# Patient Record
Sex: Male | Born: 1945
Health system: Southern US, Community
[De-identification: ages and names within clinical notes are randomized; demographics above are authoritative.]

## PROBLEM LIST (undated history)

## (undated) DIAGNOSIS — K5090 Crohn's disease, unspecified, without complications: Secondary | ICD-10-CM

## (undated) DIAGNOSIS — E119 Type 2 diabetes mellitus without complications: Secondary | ICD-10-CM

## (undated) DIAGNOSIS — R0602 Shortness of breath: Secondary | ICD-10-CM

## (undated) DIAGNOSIS — Z8711 Personal history of peptic ulcer disease: Secondary | ICD-10-CM

## (undated) DIAGNOSIS — R42 Dizziness and giddiness: Secondary | ICD-10-CM

## (undated) DIAGNOSIS — M1990 Unspecified osteoarthritis, unspecified site: Secondary | ICD-10-CM

## (undated) DIAGNOSIS — L0291 Cutaneous abscess, unspecified: Secondary | ICD-10-CM

## (undated) DIAGNOSIS — E7800 Pure hypercholesterolemia, unspecified: Secondary | ICD-10-CM

## (undated) DIAGNOSIS — K760 Fatty (change of) liver, not elsewhere classified: Secondary | ICD-10-CM

## (undated) DIAGNOSIS — K5190 Ulcerative colitis, unspecified, without complications: Secondary | ICD-10-CM

## (undated) DIAGNOSIS — Z8719 Personal history of other diseases of the digestive system: Secondary | ICD-10-CM

## (undated) DIAGNOSIS — Z8673 Personal history of transient ischemic attack (TIA), and cerebral infarction without residual deficits: Secondary | ICD-10-CM

## (undated) DIAGNOSIS — K219 Gastro-esophageal reflux disease without esophagitis: Secondary | ICD-10-CM

## (undated) DIAGNOSIS — E739 Lactose intolerance, unspecified: Secondary | ICD-10-CM

## (undated) HISTORY — DX: Personal history of transient ischemic attack (TIA), and cerebral infarction without residual deficits: Z86.73

## (undated) HISTORY — DX: Crohn's disease, unspecified, without complications: K50.90

## (undated) HISTORY — DX: Ulcerative colitis, unspecified, without complications: K51.90

## (undated) HISTORY — DX: Lactose intolerance, unspecified: E73.9

## (undated) HISTORY — PX: SHOULDER SURGERY: SHX246

## (undated) HISTORY — PX: OTHER SURGICAL HISTORY: SHX169

## (undated) HISTORY — DX: Pure hypercholesterolemia, unspecified: E78.00

## (undated) HISTORY — DX: Dizziness and giddiness: R42

## (undated) HISTORY — DX: Unspecified osteoarthritis, unspecified site: M19.90

## (undated) HISTORY — DX: Type 2 diabetes mellitus without complications: E11.9

## (undated) HISTORY — DX: Shortness of breath: R06.02

## (undated) HISTORY — DX: Fatty (change of) liver, not elsewhere classified: K76.0

---

## 1983-08-20 HISTORY — PX: OTHER SURGICAL HISTORY: SHX169

## 2008-01-18 ENCOUNTER — Encounter: Payer: Self-pay | Admitting: Internal Medicine

## 2008-01-18 ENCOUNTER — Encounter: Payer: Self-pay | Admitting: Physician Assistant

## 2008-01-26 ENCOUNTER — Encounter: Payer: Self-pay | Admitting: Internal Medicine

## 2008-04-27 ENCOUNTER — Encounter: Payer: Self-pay | Admitting: Internal Medicine

## 2009-06-14 ENCOUNTER — Ambulatory Visit: Payer: Self-pay | Admitting: Physician Assistant

## 2009-06-14 DIAGNOSIS — K5730 Diverticulosis of large intestine without perforation or abscess without bleeding: Secondary | ICD-10-CM | POA: Insufficient documentation

## 2009-06-14 DIAGNOSIS — R0602 Shortness of breath: Secondary | ICD-10-CM | POA: Insufficient documentation

## 2009-06-14 DIAGNOSIS — K7689 Other specified diseases of liver: Secondary | ICD-10-CM | POA: Insufficient documentation

## 2009-06-14 DIAGNOSIS — K219 Gastro-esophageal reflux disease without esophagitis: Secondary | ICD-10-CM | POA: Insufficient documentation

## 2009-06-14 DIAGNOSIS — K5190 Ulcerative colitis, unspecified, without complications: Secondary | ICD-10-CM | POA: Insufficient documentation

## 2009-06-14 DIAGNOSIS — M1990 Unspecified osteoarthritis, unspecified site: Secondary | ICD-10-CM | POA: Insufficient documentation

## 2009-06-15 ENCOUNTER — Encounter: Payer: Self-pay | Admitting: Physician Assistant

## 2009-06-15 ENCOUNTER — Ambulatory Visit (HOSPITAL_COMMUNITY): Admission: RE | Admit: 2009-06-15 | Discharge: 2009-06-15 | Payer: Self-pay | Admitting: Physician Assistant

## 2009-06-23 LAB — CONVERTED CEMR LAB
ALT: 27 units/L (ref 0–53)
AST: 24 units/L (ref 0–37)
Albumin: 3.9 g/dL (ref 3.5–5.2)
Alkaline Phosphatase: 85 units/L (ref 39–117)
BUN: 17 mg/dL (ref 6–23)
Basophils Absolute: 0.1 10*3/uL (ref 0.0–0.1)
Basophils Relative: 1 % (ref 0–1)
CO2: 23 meq/L (ref 19–32)
Calcium: 8.7 mg/dL (ref 8.4–10.5)
Chloride: 107 meq/L (ref 96–112)
Creatinine, Ser: 1.02 mg/dL (ref 0.40–1.50)
Eosinophils Absolute: 0.4 10*3/uL (ref 0.0–0.7)
Eosinophils Relative: 5 % (ref 0–5)
Glucose, Bld: 110 mg/dL — ABNORMAL HIGH (ref 70–99)
HCT: 45.1 % (ref 39.0–52.0)
Hemoglobin: 15.2 g/dL (ref 13.0–17.0)
Lymphocytes Relative: 48 % — ABNORMAL HIGH (ref 12–46)
Lymphs Abs: 3.8 10*3/uL (ref 0.7–4.0)
MCHC: 33.7 g/dL (ref 30.0–36.0)
MCV: 90.4 fL (ref 78.0–100.0)
Monocytes Absolute: 0.6 10*3/uL (ref 0.1–1.0)
Monocytes Relative: 7 % (ref 3–12)
Neutro Abs: 3.1 10*3/uL (ref 1.7–7.7)
Neutrophils Relative %: 39 % — ABNORMAL LOW (ref 43–77)
Platelets: 180 10*3/uL (ref 150–400)
Potassium: 3.9 meq/L (ref 3.5–5.3)
Pro B Natriuretic peptide (BNP): 7.3 pg/mL (ref 0.0–100.0)
RBC: 4.99 M/uL (ref 4.22–5.81)
RDW: 12.5 % (ref 11.5–15.5)
Sodium: 142 meq/L (ref 135–145)
TSH: 2.179 microintl units/mL (ref 0.350–4.500)
Total Bilirubin: 0.3 mg/dL (ref 0.3–1.2)
Total Protein: 7.2 g/dL (ref 6.0–8.3)
WBC: 8 10*3/uL (ref 4.0–10.5)

## 2009-06-28 ENCOUNTER — Telehealth: Payer: Self-pay | Admitting: Physician Assistant

## 2009-07-12 ENCOUNTER — Ambulatory Visit: Payer: Self-pay | Admitting: Physician Assistant

## 2009-07-17 ENCOUNTER — Telehealth: Payer: Self-pay | Admitting: Physician Assistant

## 2009-07-17 LAB — CONVERTED CEMR LAB
Cholesterol: 278 mg/dL — ABNORMAL HIGH (ref 0–200)
HDL: 47 mg/dL (ref >39)
LDL Cholesterol: 161 mg/dL — ABNORMAL HIGH (ref 0–99)
Total CHOL/HDL Ratio: 5.9
Triglycerides: 349 mg/dL — ABNORMAL HIGH (ref <150)
VLDL: 70 mg/dL — ABNORMAL HIGH (ref 0–40)

## 2009-07-27 ENCOUNTER — Ambulatory Visit: Payer: Self-pay | Admitting: Physician Assistant

## 2009-07-27 DIAGNOSIS — K644 Residual hemorrhoidal skin tags: Secondary | ICD-10-CM | POA: Insufficient documentation

## 2009-07-27 LAB — CONVERTED CEMR LAB
Blood Glucose, Fingerstick: 98
Hgb A1c MFr Bld: 5.8 %
OCCULT 1: NEGATIVE

## 2009-07-31 ENCOUNTER — Encounter: Payer: Self-pay | Admitting: Physician Assistant

## 2009-08-02 ENCOUNTER — Encounter: Payer: Self-pay | Admitting: Physician Assistant

## 2009-08-02 ENCOUNTER — Ambulatory Visit (HOSPITAL_COMMUNITY): Admission: RE | Admit: 2009-08-02 | Discharge: 2009-08-02 | Payer: Self-pay | Admitting: Internal Medicine

## 2009-08-02 ENCOUNTER — Ambulatory Visit: Payer: Self-pay | Admitting: Internal Medicine

## 2009-08-02 ENCOUNTER — Encounter (INDEPENDENT_AMBULATORY_CARE_PROVIDER_SITE_OTHER): Payer: Self-pay | Admitting: Internal Medicine

## 2009-08-08 ENCOUNTER — Encounter: Payer: Self-pay | Admitting: Physician Assistant

## 2009-08-09 ENCOUNTER — Encounter: Payer: Self-pay | Admitting: Physician Assistant

## 2009-09-08 ENCOUNTER — Ambulatory Visit (HOSPITAL_BASED_OUTPATIENT_CLINIC_OR_DEPARTMENT_OTHER): Admission: RE | Admit: 2009-09-08 | Discharge: 2009-09-08 | Payer: Self-pay | Admitting: Physician Assistant

## 2009-09-08 ENCOUNTER — Encounter: Payer: Self-pay | Admitting: Physician Assistant

## 2009-09-09 ENCOUNTER — Ambulatory Visit: Payer: Self-pay | Admitting: Internal Medicine

## 2009-09-18 ENCOUNTER — Encounter: Payer: Self-pay | Admitting: Physician Assistant

## 2009-09-18 DIAGNOSIS — G4730 Sleep apnea, unspecified: Secondary | ICD-10-CM | POA: Insufficient documentation

## 2009-10-12 ENCOUNTER — Ambulatory Visit (HOSPITAL_BASED_OUTPATIENT_CLINIC_OR_DEPARTMENT_OTHER): Admission: RE | Admit: 2009-10-12 | Discharge: 2009-10-12 | Payer: Self-pay | Admitting: Physician Assistant

## 2009-10-12 ENCOUNTER — Encounter: Payer: Self-pay | Admitting: Physician Assistant

## 2009-10-15 ENCOUNTER — Ambulatory Visit: Payer: Self-pay | Admitting: Internal Medicine

## 2009-10-25 ENCOUNTER — Ambulatory Visit: Payer: Self-pay | Admitting: Physician Assistant

## 2009-10-25 ENCOUNTER — Encounter (INDEPENDENT_AMBULATORY_CARE_PROVIDER_SITE_OTHER): Payer: Self-pay | Admitting: *Deleted

## 2009-10-25 DIAGNOSIS — R7309 Other abnormal glucose: Secondary | ICD-10-CM | POA: Insufficient documentation

## 2009-10-26 DIAGNOSIS — E739 Lactose intolerance, unspecified: Secondary | ICD-10-CM | POA: Insufficient documentation

## 2009-10-26 LAB — CONVERTED CEMR LAB
Hgb A1c MFr Bld: 6.1 % (ref 4.6–6.1)
PSA: 1.96 ng/mL (ref 0.10–4.00)

## 2009-10-29 ENCOUNTER — Encounter: Payer: Self-pay | Admitting: Physician Assistant

## 2009-11-07 ENCOUNTER — Ambulatory Visit: Payer: Self-pay | Admitting: Physician Assistant

## 2009-11-27 ENCOUNTER — Ambulatory Visit: Payer: Self-pay | Admitting: Internal Medicine

## 2009-11-27 ENCOUNTER — Encounter (INDEPENDENT_AMBULATORY_CARE_PROVIDER_SITE_OTHER): Payer: Self-pay | Admitting: *Deleted

## 2009-12-01 ENCOUNTER — Ambulatory Visit: Payer: Self-pay | Admitting: Internal Medicine

## 2009-12-06 ENCOUNTER — Encounter: Payer: Self-pay | Admitting: Internal Medicine

## 2010-02-06 ENCOUNTER — Encounter: Payer: Self-pay | Admitting: Physician Assistant

## 2010-02-26 ENCOUNTER — Telehealth: Payer: Self-pay | Admitting: Physician Assistant

## 2010-02-26 ENCOUNTER — Ambulatory Visit: Payer: Self-pay | Admitting: Physician Assistant

## 2010-02-26 DIAGNOSIS — E785 Hyperlipidemia, unspecified: Secondary | ICD-10-CM | POA: Insufficient documentation

## 2010-02-26 LAB — CONVERTED CEMR LAB: Blood Glucose, Fingerstick: 94

## 2010-02-27 LAB — CONVERTED CEMR LAB
ALT: 24 units/L (ref 0–53)
AST: 21 units/L (ref 0–37)
Albumin: 4.4 g/dL (ref 3.5–5.2)
Alkaline Phosphatase: 70 units/L (ref 39–117)
BUN: 18 mg/dL (ref 6–23)
CO2: 20 meq/L (ref 19–32)
Calcium: 8.5 mg/dL (ref 8.4–10.5)
Chloride: 107 meq/L (ref 96–112)
Creatinine, Ser: 1.16 mg/dL (ref 0.40–1.50)
Glucose, Bld: 107 mg/dL — ABNORMAL HIGH (ref 70–99)
Hgb A1c MFr Bld: 5.8 % — ABNORMAL HIGH (ref <5.7)
Potassium: 3.8 meq/L (ref 3.5–5.3)
Sodium: 140 meq/L (ref 135–145)
Total Bilirubin: 0.4 mg/dL (ref 0.3–1.2)
Total Protein: 7.4 g/dL (ref 6.0–8.3)

## 2010-02-28 ENCOUNTER — Encounter: Payer: Self-pay | Admitting: Physician Assistant

## 2010-03-08 ENCOUNTER — Telehealth: Payer: Self-pay | Admitting: Physician Assistant

## 2010-03-09 ENCOUNTER — Encounter (INDEPENDENT_AMBULATORY_CARE_PROVIDER_SITE_OTHER): Payer: Self-pay | Admitting: *Deleted

## 2010-05-29 ENCOUNTER — Ambulatory Visit: Payer: Self-pay | Admitting: Physician Assistant

## 2010-05-30 LAB — CONVERTED CEMR LAB
ALT: 25 units/L (ref 0–53)
AST: 26 units/L (ref 0–37)
Albumin: 4.1 g/dL (ref 3.5–5.2)
Alkaline Phosphatase: 79 units/L (ref 39–117)
Bilirubin, Direct: 0.2 mg/dL (ref 0.0–0.3)
Cholesterol: 208 mg/dL — ABNORMAL HIGH (ref 0–200)
HDL: 55 mg/dL (ref >39)
Indirect Bilirubin: 0.5 mg/dL (ref 0.0–0.9)
LDL Cholesterol: 116 mg/dL — ABNORMAL HIGH (ref 0–99)
Total Bilirubin: 0.7 mg/dL (ref 0.3–1.2)
Total CHOL/HDL Ratio: 3.8
Total Protein: 7.3 g/dL (ref 6.0–8.3)
Triglycerides: 183 mg/dL — ABNORMAL HIGH (ref <150)
VLDL: 37 mg/dL (ref 0–40)

## 2010-06-06 ENCOUNTER — Encounter (INDEPENDENT_AMBULATORY_CARE_PROVIDER_SITE_OTHER): Payer: Self-pay | Admitting: *Deleted

## 2010-08-14 ENCOUNTER — Ambulatory Visit: Payer: Self-pay | Admitting: Physician Assistant

## 2010-08-14 DIAGNOSIS — B029 Zoster without complications: Secondary | ICD-10-CM | POA: Insufficient documentation

## 2010-08-14 LAB — CONVERTED CEMR LAB
Blood Glucose, Fingerstick: 100
Hgb A1c MFr Bld: 5.8 %

## 2010-09-18 NOTE — Miscellaneous (Signed)
Summary: Sleep Apnea dx by sleep study  Patient has sleep apnea. Patient needs referral for CPAP titration. Order in system.   will fax... Armenia Shannon  September 20, 2009 9:16 AM  Clinical Lists Changes  Problems: Added new problem of SLEEP APNEA (ICD-780.57) - Signed Orders: Added new Referral order of Sleep Disorder Referral (Sleep Disorder) - Signed Observations: Added new observation of PAST MED HX: Diverticulosis Ulcerative Colitis    a.  colonoscopy June 2009 c/w diverticulosis and ulcerative colitis    b.  had burning and rectal bleeding which prompted work up Fatty Liver on u/s 01/2008 (u/s o/w normal at that time) GERD Mild dyslipidemia in past Intestinal parasite infection treated over last 20 years Osteoarthritis Echo 08/02/2009: Normal LVF; Grade 1 Diast. Dysfxn; no valve issues Sleep Apnea   a.  Mod. OSA by sleep study 09/08/2009; AHI 18.3 per hour; O2 sat nadir of 88% (09/18/2009 16:17)       Past History:  Past Medical History: Diverticulosis Ulcerative Colitis    a.  colonoscopy June 2009 c/w diverticulosis and ulcerative colitis    b.  had burning and rectal bleeding which prompted work up Fatty Liver on u/s 01/2008 (u/s o/w normal at that time) GERD Mild dyslipidemia in past Intestinal parasite infection treated over last 20 years Osteoarthritis Echo 08/02/2009: Normal LVF; Grade 1 Diast. Dysfxn; no valve issues Sleep Apnea   a.  Mod. OSA by sleep study 09/08/2009; AHI 18.3 per hour; O2 sat nadir of 88%

## 2010-09-18 NOTE — Letter (Signed)
Summary: *HSN Results Follow up  [facility]  [address] [place], [place] [address]   Phone: [phone]  Fax: [phone]      06/15/2009   Jack [name] [address] [place], [place]  [address]   Dear  Mr. Jack [name],                            ____S.Drinkard,FNP   ____D. Gore,FNP       ____B. McPherson,MD   ____V. Rankins,MD    ____E. Mulberry,MD    ____N. Daphine Deutscher, FNP  ____D. Reche Dixon, MD    ____K. Philipp Deputy, MD    __x__S. Alben Spittle, PA-C     This letter is to inform you that your recent test(s):  _______Pap Smear    _______Lab Test     ___x____X-ray    ___x____ is within acceptable limits  _______ requires a medication change  _______ requires a follow-up lab visit  _______ requires a follow-up visit with your Jack [name]   Comments:  Chest Xray showed some chronic changes but nothing worrisome.       _________________________________________________________ If you have any questions, please contact our office                     Sincerely,  Tereso Newcomer PA-C [facility]

## 2010-09-18 NOTE — Letter (Signed)
Summary: *HSN Results Follow up  [facility]  [address] [place], [place] [address]   Phone: [phone]  Fax: [phone]      02/28/2010   [facility] [address] [place], [place]  [address]   Dear  Mr. Jack [name],                            ____S.Drinkard,FNP   ____D. Gore,FNP       ____B. McPherson,MD   ____V. Rankins,MD    ____E. Mulberry,MD    ____N. Daphine Deutscher, FNP  ____D. Reche Dixon, MD    ____K. Philipp Deputy, MD    __X__ Jack [name], [place]     This letter is to inform you that your recent test(s):  _______Pap Smear    ____X___Lab Test     _______X-ray    ___X____ is within acceptable limits  _______ requires a medication change  _______ requires a follow-up lab visit  _______ requires a follow-up visit with your provider   Comments:       _________________________________________________________ If you have any questions, please contact our office                     Sincerely,  Dutch Quint RN [facility]

## 2010-09-18 NOTE — Miscellaneous (Signed)
Summary: CPAP Titration   Patient had CPAP titration. Referral to home health in system to get CPAP set up at home.  ok... will send referral to debra... [place] Shannon  October 30, 2009 9:05 AM    Clinical Lists Changes  Problems: Assessed SLEEP APNEA as comment only -  needs CPAP set up at home  Orders: Home Health Referral [facility])  - Signed Orders: Added new Referral order of Home Health Referral [facility]) - Signed Observations: Added new observation of RESULTS MISC: Type of Report:  Polysomnogram with CPAP titration  1. Succesful CPAP titration to 11 CWP, AHI 1.1 per hour.  He wore a medium ResMed Mirage Quattro mask with heated humidfier.  2.  Baseline diagnosic NPSG on 09/08/2009:  AHI of 18.3 per hour.  (10/12/2009 22:13)      MISC. Report  Procedure date:  10/12/2009  Findings:      Type of Report:  Polysomnogram with CPAP titration  1. Succesful CPAP titration to 11 CWP, AHI 1.1 per hour.  He wore a medium ResMed Mirage Quattro mask with heated humidfier.  2.  Baseline diagnosic NPSG on 09/08/2009:  AHI of 18.3 per hour.     Impression & Recommendations:  Problem # 1:  SLEEP APNEA (ICD-780.57)  needs CPAP set up at home  Orders: Home Health Referral (Home Health)  Complete Medication List: 1)  Nexium 40 Mg Cpdr (Esomeprazole magnesium) .... Take 1 tablet by mouth once a day 2)  Anusol-hc 25 Mg Supp (Hydrocortisone acetate) .Marland Kitchen.. 1 rectally three times a day as needed for hemorrhoids (please write in spanish) 3)  Fish Oil 1000 Mg Caps (Omega-3 fatty acids) .... 3 by mouth once daily

## 2010-09-18 NOTE — Letter (Signed)
Summary: REFERRAL FOR SLEEP STUDY  REFERRAL FOR SLEEP STUDY   Imported By: Arta Bruce 09/15/2009 12:41:15  _____________________________________________________________________  External Attachment:    Type:   Image     Comment:   External Document

## 2010-09-18 NOTE — Procedures (Signed)
Summary: Colonoscopy / [facility]  Colonoscopy / [facility]   Imported By: Lennie Odor 12/20/2009 10:24:56  _____________________________________________________________________  External Attachment:    Type:   Image     Comment:   External Document

## 2010-09-18 NOTE — Letter (Signed)
[facility]   Imported By: Arta Bruce 01/25/2010 15:29:24  _____________________________________________________________________  External Attachment:    Type:   Image     Comment:   External Document

## 2010-09-18 NOTE — Letter (Signed)
Summary: NOCTURNAL POLYSOMNOGRAM  NOCTURNAL POLYSOMNOGRAM   Imported By: Arta Bruce 12/28/2009 12:02:52  _____________________________________________________________________  External Attachment:    Type:   Image     Comment:   External Document

## 2010-09-18 NOTE — Progress Notes (Signed)
  Phone Note Other Incoming   Summary of Call: See patient's chol. panel. He can start on Fish Oil 1000 mg once daily x 4 weeks, then increase to 2 tabs once daily.  He was supposed to get a Hgb A1C.  I don't see results. Make sure he gets it done.  Initial call taken by: Brynda Rim,  July 17, 2009 5:43 PM  Follow-up for Phone Call        pt has appt today Follow-up by: [place] Shannon,  July 27, 2009 3:17 PM

## 2010-09-18 NOTE — Assessment & Plan Note (Signed)
Summary: ulcerative collitis...as.   History of Present Illness Visit Type: consult  Primary GI MD: Stan Head MD [facility] Primary Provider: Julieanne Manson, MD  Requesting Provider: Julieanne Manson, MD  Chief Complaint: Colorectal screening  History of Present Illness:   65 yo [place] man with hx of colitis. He had a colonoscopy in [place] 2009 with rectosigmoid colitis showing serpiginous ulcers and diverticulosis in the sigmoid colon. It is not clear why he had this, though I think he was having some rectal bleeding. He has no sxs now. On AnusolHC for intermittent rectl pain wth defecaton. He took suppositories then - says same as now. HC suppositories.Nexium x 1 year controlling heartburn.  History taken using a Spanish interpreter.    GI Review of Systems      Denies abdominal pain, acid reflux, belching, bloating, chest pain, dysphagia with liquids, dysphagia with solids, heartburn, loss of appetite, nausea, vomiting, vomiting blood, weight loss, and  weight gain.        Denies anal fissure, black tarry stools, change in bowel habit, constipation, diarrhea, diverticulosis, fecal incontinence, heme positive stool, hemorrhoids, irritable bowel syndrome, jaundice, light color stool, liver problems, rectal bleeding, and  rectal pain.    Current Medications (verified): 1)  Nexium 40 Mg Cpdr (Esomeprazole Magnesium) .... Take 1 Tablet By Mouth Once A Day 2)  Anusol-Hc 25 Mg Supp (Hydrocortisone Acetate) .Marland Kitchen.. 1 Rectally Three Times A Day As Needed For Hemorrhoids (Please Write in Spanish) 3)  Fish Oil 1000 Mg Caps (Omega-3 Fatty Acids) .... 3 By Mouth Once Daily  Allergies (verified): No Known Drug Allergies  Past History:  Past Medical History: Diverticulosis Ulcerative Colitis    a.  colonoscopy June 2009 c/w diverticulosis and ulcerative colitis - proctosigmoiditis    b.  had burning and rectal bleeding which prompted work up Fatty Liver on u/s 01/2008 (u/s o/w normal  at that time) GERD Mild dyslipidemia in past Intestinal parasite infection treated over last 20 years Osteoarthritis Echo 08/02/2009: Normal LVF; Grade 1 Diast. Dysfxn; no valve issues Sleep Apnea   a.  Mod. OSA by sleep study 09/08/2009; AHI 18.3 per hour; O2 sat nadir of 88%  Past Surgical History: Reviewed history from 06/14/2009 and no changes required. umbilical hernia repair  Colonoscopy  Procedure date:  01/26/2008  Findings:      proctosigmoiditis (rectum to 40 cm)  sigmoid diverticulosis ? if cecum seen  [place]  [place] of Abdomen  Procedure date:  01/18/2008  Findings:      Fatty Liver Echogenic pancreas  [place]   Family History: No heart disease Family History Diabetes 1st degree relative (dad) Uterine CA - mom No FH of Colon Cancer:  Social History: Occupation Product/process development scientist Married From [place] x 7 years Former Smoker Alcohol Use - yes: Occ Daily Caffeine Use: 2-3 daily  Illicit Drug Use - no Drug Use:  no  Review of Systems       All other ROS negative except as per HPI.   Vital Signs:  Patient profile:   65 year old male Height:      65.25 inches Weight:      188 pounds BMI:     31.16 BSA:     1.93 Pulse rate:   88 / minute Pulse rhythm:   regular BP sitting:   132 / 86  (left arm)  Vitals Entered By: Ok Anis CMA (November 27, 2009 8:47 AM)  Physical Exam  General:  Well developed, well nourished,  no acute distress. Overweight Eyes:  PERRLA, no icterus. Mouth:  No deformity or lesions, dentition normal. Neck:  Supple; no masses or thyromegaly. Lungs:  Clear throughout to auscultation. Heart:  Regular rate and rhythm; no murmurs, rubs,  or bruits. Abdomen:  obese soft and non-tender no HSM or mass BS+ Rectal:  deferred until time of colonoscopy.   Extremities:  No clubbing, cyanosis, edema or deformities noted. Cervical Nodes:  No significant cervical or supraclavicular adenopathy.  Psych:  Alert and  cooperative. Normal mood and affect.   Impression & Recommendations:  Problem # 1:  ULCERATIVE COLITIS (ICD-556.9) Assessment Comment Only NEW yo GI 2009 diagnosis in [place] - ulcerative proctosigmodiitis some rectal pain only at this time merits reassesment with colonoscopy Risks, benefits,and indications of endoscopic procedure(s) were reviewed with the patient and all questions answered. Interpreter used to explain  Orders: Colonoscopy (Colon)  Problem # 2:  GERD (ICD-530.81) Assessment: Comment Only NEW to GI controlled on Nexium - to continue  Patient Instructions: 1)  Copy sent to : Tereso Newcomer, PA-C 2)  We will see you at your procedure on 12/01/09. 3)  [facility] Patient Information Guide given to patient.  4)  Colonoscopy and Flexible Sigmoidoscopy brochure given.  5)  The medication list was reviewed and reconciled.  All changed / newly prescribed medications were explained.  A complete medication list was provided to the patient / caregiver. Prescriptions: MOVIPREP 100 GM  SOLR (PEG-KCL-NACL-NASULF-NA ASC-C) As per prep instructions.  #1 x 0   Entered by:   Francee Piccolo CMA (AAMA)   Authorized by:   Iva Boop MD, [facility]   Signed by:   Francee Piccolo CMA (AAMA) on 11/27/2009   Method used:   Samples Given   RxID:   [id number]

## 2010-09-18 NOTE — Progress Notes (Signed)
Summary: nexium refills request  Phone Note Call from Patient Call back at [facility] Phone [phone]   Summary of Call: When the pt came here, he got various prescriptions and [facility] but he got everything with the exception of nexium.  The [facility] fax the request almost three weeks ago but they still are not receiving any response. Tereso Newcomer PA-C Initial call taken by: Manon Hilding,  June 28, 2009 4:58 PM  Follow-up for Phone Call        forward to provder Follow-up by: [place] Shannon,  June 28, 2009 5:47 PM  Additional Follow-up for Phone Call Additional follow up Details #1::        Rx printed - at station fax to [facility] notify pt that it has been faxed and he can check there as to when it will be ready Additional Follow-up by: Lehman Prom FNP,  June 28, 2009 6:07 PM    Additional Follow-up for Phone Call Additional follow up Details #2::    pt is informed about med at pharmacy Follow-up by: [place] Shannon,  June 29, 2009 9:37 AM  Prescriptions: NEXIUM 40 MG CPDR (ESOMEPRAZOLE MAGNESIUM) Take 1 tablet by mouth once a day  #30 x 6   Entered and Authorized by:   Lehman Prom FNP   Signed by:   Lehman Prom FNP on 06/28/2009   Method used:   Printed then faxed to ...       [facility] Department (retail)       [address] [place], [place]  [address]       Ph: [phone]       Fax: [phone]   RxID:   [id number]

## 2010-09-18 NOTE — Assessment & Plan Note (Signed)
Summary: GERD AND HEMORROIDS////CNS   Vital Signs:  Patient profile:   65 year old male Height:      65.25 inches Weight:      191 pounds BMI:     31.65 Temp:     98.4 degrees F oral Pulse rate:   84 / minute Pulse rhythm:   regular Resp:     18 per minute BP sitting:   126 / 82  (left arm) Cuff size:   large  Vitals Entered By: [place] Shannon (October 25, 2009 3:17 PM) CC: f/u... Is Patient Diabetic? No Pain Assessment Patient in pain? no       Does patient need assistance? Functional Status Self care Ambulation Normal   CC:  f/u....  History of Present Illness: Here for f/u. Feeling good. Wife is in hosp having AVR. He denies any further perirectal burning. He denies diarrhea.  He denies hematochezia. He denies abd cramping. No dyspepsia while taking PPI.   Problems Prior to Update: 1)  Glucose Intolerance  (ICD-271.3) 2)  Hyperglycemia  (ICD-790.29) 3)  Sleep Apnea  (ICD-780.57) 4)  Hemorrhoids, External  (ICD-455.3) 5)  Preventive Health Care  (ICD-V70.0) 6)  Dyspnea  (ICD-786.05) 7)  Family History Diabetes 1st Degree Relative  (ICD-V18.0) 8)  Fatty Liver Disease  (ICD-571.8) 9)  Ulcerative Colitis  (ICD-556.9) 10)  Diverticulosis, Colon  (ICD-562.10) 11)  Osteoarthritis  (ICD-715.90) 12)  Gerd  (ICD-530.81)  Current Medications (verified): 1)  Nexium 40 Mg Cpdr (Esomeprazole Magnesium) .... Take 1 Tablet By Mouth Once A Day 2)  Anusol-Hc 25 Mg Supp (Hydrocortisone Acetate) .Marland Kitchen.. 1 Rectally Three Times A Day As Needed For Hemorrhoids (Please Write in Spanish)  Allergies (verified): No Known Drug Allergies  Past History:  Past Medical History: Last updated: 09/18/2009 Diverticulosis Ulcerative Colitis    a.  colonoscopy June 2009 c/w diverticulosis and ulcerative colitis    b.  had burning and rectal bleeding which prompted work up Fatty Liver on u/s 01/2008 (u/s o/w normal at that time) GERD Mild dyslipidemia in past Intestinal parasite  infection treated over last 20 years Osteoarthritis Echo 08/02/2009: Normal LVF; Grade 1 Diast. Dysfxn; no valve issues Sleep Apnea   a.  Mod. OSA by sleep study 09/08/2009; AHI 18.3 per hour; O2 sat nadir of 88%  Past Surgical History: Last updated: 06/14/2009 umbilical hernia repair  Family History: Last updated: 06/14/2009 No heart disease Family History Diabetes 1st degree relative (dad) Uterine CA - mom  Social History: Last updated: 06/14/2009 From [place] x 7 years Former Smoker Married  Review of Systems  The patient denies fever, chest pain, syncope, dyspnea on exertion, and hematuria.    Physical Exam  General:  alert, well-developed, and well-nourished.   Head:  normocephalic and atraumatic.   Neck:  supple.   Lungs:  normal breath sounds, no crackles, and no wheezes.   Heart:  normal rate and regular rhythm.   Abdomen:  soft, non-tender, normal bowel sounds, and no hepatomegaly.   Neurologic:  alert & oriented X3 and cranial nerves II-XII intact.   Psych:  normally interactive.     Impression & Recommendations:  Problem # 1:  PREVENTIVE HEALTH CARE (ICD-V70.0)  refused flu shot Td up to date discussed PSA test, indications and implications for f/u. . . he wants PSA drawn colo done in [place] but never got an accurate interpretation. . . he used to be on mesalamine but does not currently have any symptoms of UC d/w  patient going to [facility] for colo to have a better understanding and he is ok with going for colo  Orders: Gastroenterology Referral (GI) T-PSA ([id number])  Problem # 2:  SLEEP APNEA (ICD-780.57) will check on getting him CPAP  Problem # 3:  DYSPNEA (ICD-786.05) resolved echo ok  Problem # 4:  GERD (ICD-530.81) controlled  His updated medication list for this problem includes:    Nexium 40 Mg Cpdr (Esomeprazole magnesium) .Marland Kitchen... Take 1 tablet by mouth once a day  Problem # 5:  HEMORRHOIDS, EXTERNAL  (ICD-455.3) improved  Problem # 6:  ULCERATIVE COLITIS (ICD-556.9)  see problem no. 1  Orders: Gastroenterology Referral (GI)  Complete Medication List: 1)  Nexium 40 Mg Cpdr (Esomeprazole magnesium) .... Take 1 tablet by mouth once a day 2)  Anusol-hc 25 Mg Supp (Hydrocortisone acetate) .Marland Kitchen.. 1 rectally three times a day as needed for hemorrhoids (please write in spanish) 3)  Fish Oil 1000 Mg Caps (Omega-3 fatty acids) .... 3 by mouth once daily  Other Orders: T- Hemoglobin A1C ([id number])  Patient Instructions: 1)  Please schedule a follow-up appointment in 1 year for CPE with Mariette Cowley. 2)  Return to the lab in May for CMET, Lipids (272.4).  Come fasting (nothing to eat or drink after midnight except water). 3)  We will contact you to schedule a colonoscopy at [facility].

## 2010-09-18 NOTE — Letter (Signed)
Summary: DIAGNOSTICA//DR.JUAN SANTOS  DIAGNOSTICA//DR.JUAN SANTOS   Imported By: Arta Bruce 02/06/2010 15:15:46  _____________________________________________________________________  External Attachment:    Type:   Image     Comment:   External Document

## 2010-09-18 NOTE — Letter (Signed)
Summary: *HSN Results Follow up  [facility]  [address] [place], [place] [address]   Phone: [phone]  Fax: [phone]      08/08/2009   DARNEL MCHAN [address] [place], [place]  [address]   Dear  Mr. Annette Hovanec,                            ____S.Drinkard,FNP   ____D. Gore,FNP       ____B. McPherson,MD   ____V. Rankins,MD    ____E. Mulberry,MD    ____N. Daphine Deutscher, FNP  ____D. Reche Dixon, MD    ____K. Philipp Deputy, MD    __x__S. Alben Spittle, PA-C     This letter is to inform you that your recent test(s):  _______Pap Smear    _______Lab Test     _______X-ray    _______ is within acceptable limits  _______ requires a medication change  _______ requires a follow-up lab visit  _______ requires a follow-up visit with your Bonney Berres   Comments: Echocardiogram (ultrasound on heart) looked normal.       _________________________________________________________ If you have any questions, please contact our office                     Sincerely,  Tereso Newcomer PA-C [facility]

## 2010-09-18 NOTE — Procedures (Signed)
Summary: Colonoscopy  Patient: Jack [name] Note: All result statuses are Final unless otherwise noted.  Tests: (1) Colonoscopy (COL)   COL Colonoscopy           DONE      [facility]     [address]     [place], [place]  [address]           COLONOSCOPY PROCEDURE REPORT           PATIENT:  Jack [name], Jack [name]  MR#:  [id number]     BIRTHDATE:  [birth year], 63 yrs. old  GENDER:  male     ENDOSCOPIST:  Iva Boop, MD, [facility]     REF. BY:          Tereso Newcomer, PA-C     PROCEDURE DATE:  12/01/2009     PROCEDURE:  Colonoscopy with biopsy     ASA CLASS:  Class I     INDICATIONS:  rectal bleeding, evaluation of ulcerative colitis he     has a diagnosis of ulcerative proctosigmoiditis made in 2009     (colonoscopy in [place])     MEDICATIONS:   Fentanyl 50 mcg IV, Versed 5 mg IV           DESCRIPTION OF PROCEDURE:   After the risks benefits and     alternatives of the procedure were thoroughly explained, informed     consent was obtained.  Digital rectal exam was performed and     revealed no abnormalities and normal prostate.   The LB CF-[id number]     [id number] endoscope was introduced through the anus and advanced to     the terminal ileum which was intubated for a short distance,     without limitations.  The quality of the prep was excellent, using     MoviPrep.  The instrument was then slowly withdrawn as the colon     was fully examined.     Insertion: 2:22 minutes Withdrawl: 7:22 minutes     <<PROCEDUREIMAGES>>           FINDINGS:  The terminal ileum appeared normal.  A normal appearing     cecum, ileocecal valve, and appendiceal orifice were identified.     The ascending, hepatic flexure, transverse, splenic flexure,     descending, and sigmoid colon appeared unremarkable. Multiple     biopsies were obtained and sent to pathology. From sigmoid, bottle     1.  Proctitis was identified. in the rectum. Erythema and granular     mucosa, mild. Multiple biopsies were  obtained and sent to     pathology. Bottle 1 also.   Retroflexed views in the rectum     revealed no other findings other than those already described.     The scope was then withdrawn from the patient and the procedure     completed.           COMPLICATIONS:  None     ENDOSCOPIC IMPRESSION:     1) Proctitis in the rectum (likely) - mild (he has been on     hydrocortisone suppositories)     2) Normal terminal ileum     3) Normal colon otherwise     RECOMMENDATIONS:     1) Await biopsy results     2) Continue current medications           REPEAT EXAM:  In for Colonoscopy, pending biopsy results.  Iva Boop, MD, [facility]           CC:  Tereso Newcomer PA     The Patient           n.     eSIGNED:   Iva Boop at 12/01/2009 12:51 PM           [place], [place], [id number]  Note: An exclamation mark (!) indicates a result that was not dispersed into the flowsheet. Document Creation Date: 12/01/2009 12:52 PM _______________________________________________________________________  (1) Order result status: Final Collection or observation date-time: 12/01/2009 12:38 Requested date-time:  Receipt date-time:  Reported date-time:  Referring Physician:   Ordering Physician: Stan Head [phone]) Specimen Source:  Source: Launa Grill Order Number: [id number] Lab site:   Appended Document: Colonoscopy     Procedures Next Due Date:    Colonoscopy: 11/2019

## 2010-09-18 NOTE — Letter (Signed)
Summary: NUTRITIONIST SUMMARY/SUSIE  NUTRITIONIST SUMMARY/SUSIE   Imported By: Arta Bruce 11/15/2009 10:49:35  _____________________________________________________________________  External Attachment:    Type:   Image     Comment:   External Document

## 2010-09-18 NOTE — Letter (Signed)
Summary: PATIENT INFORMATION SHEET  PATIENT INFORMATION SHEET   Imported By: Arta Bruce 06/16/2009 12:26:33  _____________________________________________________________________  External Attachment:    Type:   Image     Comment:   External Document

## 2010-09-18 NOTE — Progress Notes (Signed)
Summary: Home Health referral  Phone Note Outgoing Call   Summary of Call: Patient needs home health referral for CPAP.  This was put in the system in February.  I am going to change the date on the referral. Initial call taken by: Brynda Rim,  February 26, 2010 5:23 PM

## 2010-09-18 NOTE — Letter (Signed)
Summary: ECHO REPORT  ECHO REPORT   Imported By: Arta Bruce 10/27/2009 15:57:33  _____________________________________________________________________  External Attachment:    Type:   Image     Comment:   External Document

## 2010-09-18 NOTE — Letter (Signed)
Summary: REFERRAL/SLEPP STUDY  REFERRAL/SLEPP STUDY   Imported By: Arta Bruce 09/14/2009 14:14:04  _____________________________________________________________________  External Attachment:    Type:   Image     Comment:   External Document

## 2010-09-18 NOTE — Letter (Signed)
Summary: TEST ORDER FORM//2 D ECHO//APPT DATE & TIME  TEST ORDER FORM//2 D ECHO//APPT DATE & TIME   Imported By: Arta Bruce 11/29/2009 15:36:39  _____________________________________________________________________  External Attachment:    Type:   Image     Comment:   External Document

## 2010-09-18 NOTE — Assessment & Plan Note (Signed)
Summary: one month f/u sob /tmm   Vital Signs:  Patient profile:   65 year old male Height:      65.25 inches Weight:      189 pounds BMI:     31.32 O2 Sat:      93 % on Room air Temp:     98.0 degrees F oral Pulse rate:   86 / minute Pulse rhythm:   regular Resp:     18 per minute BP sitting:   117 / 76  (left arm) Cuff size:   large  Vitals Entered By: Armenia Shannon (July 27, 2009 3:36 PM)  O2 Flow:  Room air CC: ONE MONTH F/U.... PT WOULD LIKE TO KNOW RESULTS OF LAST TEST......  Is Patient Diabetic? Yes Pain Assessment Patient in pain? no      CBG Result 98  Does patient need assistance? Functional Status Self care Ambulation Normal Comments PF  1.  150    2.   240    3.   180   CC:  ONE MONTH F/U.... PT WOULD LIKE TO KNOW RESULTS OF LAST TEST...... .  History of Present Illness: Here for f/u.   He brings in name of med. he was on for colon from [place].  He states he had parasitic infection.  Also, says he had colitis.  Name of medicine is mesalazine.  This is same med. as asacol in [place].   Now, says he is having some BRBPR.  He denies diarrhea or constipation.  His stools are soft and liquid.  This is normal stool consistency for him.  He goes three times a day.  He notes burning once a month with bleeding.  Blood all on tissue.  No blood in bowl.  No melena. When he was put on mesalamine, he reports constipation.  He notes more bleeding before starting.   He was place on mesalamine for a short time interval.  Patient now notes that his breathing is normal.  He denies sob, wheezing, cough.  He denies PND, orthopnea or syncope.  No chest pain.  He admits to snoring.  Current Medications (verified): 1)  Nexium 40 Mg Cpdr (Esomeprazole Magnesium) .... Take 1 Tablet By Mouth Once A Day  Allergies (verified): No Known Drug Allergies  Past History:  Past Medical History: Last updated: 06/14/2009 Diverticulosis Ulcerative Colitis    a.  colonoscopy June 2009  c/w diverticulosis and ulcerative colitis    b.  had burning and rectal bleeding which prompted work up Fatty Liver on u/s 01/2008 (u/s o/w normal at that time) GERD Mild dyslipidemia in past Intestinal parasite infection treated over last 20 years Osteoarthritis  Past Surgical History: Last updated: 06/14/2009 umbilical hernia repair  Review of Systems General:  Denies fever. MS:  Complains of joint pain.  Physical Exam  General:  alert, well-developed, and well-nourished.   Head:  normocephalic and atraumatic.   Neck:  supple.   Lungs:  normal breath sounds, no crackles, and no wheezes.   Heart:  normal rate and regular rhythm.   Abdomen:  soft, non-tender, normal bowel sounds, and no hepatomegaly.   Rectal:  normal sphincter tone, no masses, no fissures, no perianal rash, and external hemorrhoid(s).   heme neg Prostate:  no gland enlargement, no nodules, no asymmetry, and no induration.   Msk:  bouchards nodes Neurologic:  alert & oriented X3 and cranial nerves II-XII intact.   Psych:  normally interactive.     Impression & Recommendations:  Problem # 1:  HEMORRHOIDS, EXTERNAL (ICD-455.3) anusol hc supp sitz bath f/u as needed  Problem # 2:  ULCERATIVE COLITIS (ICD-556.9) dx is still ? will likely have him see Dr. Corinda Gubler in [place] for colonoscopy he does not describe classical symptoms of UC but, given Asacol in [place] and only for a limited time will not start him back on this med unless he has a change in symptoms. . . at that time, refer to GI  Problem # 3:  DYSPNEA (ICD-786.05) now resolved echo still pending sleep study pending  Complete Medication List: 1)  Nexium 40 Mg Cpdr (Esomeprazole magnesium) .... Take 1 tablet by mouth once a day 2)  Anusol-hc 25 Mg Supp (Hydrocortisone acetate) .Marland Kitchen.. 1 rectally three times a day as needed for hemorrhoids (please write in spanish)  Patient Instructions: 1)  Please schedule a follow-up appointment in 3 months  with Zanaya Baize for hemorrhoids and GERD. 2)  Schedule sleep study. Prescriptions: ANUSOL-HC 25 MG SUPP (HYDROCORTISONE ACETATE) 1 rectally three times a day as needed for hemorrhoids (please write in Spanish)  #12 x 3   Entered and Authorized by:   Tereso Newcomer PA-C   Signed by:   Tereso Newcomer PA-C on 07/27/2009   Method used:   Print then Give to Patient   RxID:   [id number]   Laboratory Results   Blood Tests   Date/Time Received: July 27, 2009 3:41 PM   HGBA1C: 5.8%   (Normal Range: Non-Diabetic - 3-6%   Control Diabetic - 6-8%) CBG Random:: 98mg /dL    Stool - Occult Blood Hemmoccult #1: negative Date: 07/27/2009    Appended Document: one month f/u sob /tmm    Clinical Lists Changes  Orders: Added new Service order of Est. Patient Level III (14782) - Signed

## 2010-09-18 NOTE — Letter (Signed)
Summary: *HSN Results Follow up  [facility]  [address] [place], [place] [address]   Phone: [phone]  Fax: [phone]      03/09/2010   [facility] [address] [place], [place]  [address]   Dear  Mr. Jack [name],                            ____S.Drinkard,FNP   ____D. Gore,FNP       ____B. McPherson,MD   ____V. Rankins,MD    ____E. Mulberry,MD    ____N. Daphine Deutscher, FNP  ____D. Reche Dixon, MD    ____K. Philipp Deputy, MD    ____Other     This letter is to inform you that your recent test(s):  _______Pap Smear    _______Lab Test     _______X-ray    _______ is within acceptable limits  _______ requires a medication change  _______ requires a follow-up lab visit  _______ requires a follow-up visit with your provider   Comments:  We have been trying to reach you.  Please give the office a call at your earliest convenience.       _________________________________________________________ If you have any questions, please contact our office                     Sincerely,  Armenia Shannon [facility]

## 2010-09-18 NOTE — Letter (Signed)
Summary: *Referral Letter  [facility]  [address] [place], [place] [address]   Phone: [phone]  Fax: [phone]    10/25/2009  Thank you in advance for agreeing to see my patient:  [facility] [address] [place], [place]  [address]  Phone: [phone]  Reason for Referral: 65 yo [place] male with reported h/o ulcerative colitis.  He was placed on the equivalent of mesalamine in [place].  He brought me a copy of his colonoscopy from 2009 when I first met him.  This was done in [place].  The translation to English was limited in our office.  He really has no symptoms of diarrhea or cramping at this time.  He denies any hematochezia.  Please evaluate for colonoscopy.   Procedures Requested:   Current Medical Problems: 1)  HYPERGLYCEMIA (ICD-790.29) 2)  SLEEP APNEA (ICD-780.57) 3)  HEMORRHOIDS, EXTERNAL (ICD-455.3) 4)  PREVENTIVE HEALTH CARE (ICD-V70.0) 5)  DYSPNEA (ICD-786.05) 6)  FAMILY HISTORY DIABETES 1ST DEGREE RELATIVE (ICD-V18.0) 7)  FATTY LIVER DISEASE (ICD-571.8) 8)  ULCERATIVE COLITIS (ICD-556.9) 9)  DIVERTICULOSIS, COLON (ICD-562.10) 10)  OSTEOARTHRITIS (ICD-715.90) 11)  GERD (ICD-530.81)   Current Medications: 1)  NEXIUM 40 MG CPDR (ESOMEPRAZOLE MAGNESIUM) Take 1 tablet by mouth once a day 2)  ANUSOL-HC 25 MG SUPP (HYDROCORTISONE ACETATE) 1 rectally three times a day as needed for hemorrhoids (please write in Spanish) 3)  FISH OIL 1000 MG CAPS (OMEGA-3 FATTY ACIDS) 3 by mouth once daily   Past Medical History: 1)  Diverticulosis 2)  Ulcerative Colitis 3)     a.  colonoscopy June 2009 c/w diverticulosis and ulcerative colitis 4)     b.  had burning and rectal bleeding which prompted work up 5)  Fatty Liver on u/s 01/2008 (u/s o/w normal at that time) 6)  GERD 7)  Mild dyslipidemia in past 8)  Intestinal parasite infection treated over last 20 years 9)  Osteoarthritis 10)  Echo 08/02/2009: Normal LVF; Grade 1 Diast. Dysfxn; no  valve issues 11)  Sleep Apnea 12)    a.  Mod. OSA by sleep study 09/08/2009; AHI 18.3 per hour; O2 sat nadir of 88%   Prior History of Blood Transfusions:   Pertinent Labs:    Thank you again for agreeing to see our patient; please contact us if you have any further questions or need additional information.  Sincerely,  Tereso Newcomer PA-C

## 2010-09-18 NOTE — Assessment & Plan Note (Signed)
Summary: F/U WITH Kiamesha Samet IN 4 MONTHS//GK   Vital Signs:  Patient profile:   65 year old male Height:      65.25 inches Weight:      186.4 pounds BMI:     30.89 Temp:     97.8 degrees F oral Pulse rate:   85 / minute Pulse rhythm:   regular Resp:     20 per minute BP sitting:   115 / 72  (left arm) Cuff size:   large  Vitals Entered By: Armenia Shannon (February 26, 2010 4:23 PM) CC: three month f/u...., Hypertension Management Is Patient Diabetic? Yes Pain Assessment Patient in pain? no      CBG Result 94  Does patient need assistance? Functional Status Self care Ambulation Normal   Primary Care Provider:  Tereso Newcomer PA-C  CC:  three month f/u.... and Hypertension Management.  History of Present Illness: Here for f/u. Language line used. Has been back to [place] since last seen. Had abd u/s and was told he has fatty liver.  Also, hyperechogenic pancreas noted on ultrasound.  He had brought me an ultrasound when I first met him from 2009 that showed the same thing.   He has high cholesterol.  Recently, he was noted to have an A1C of 6.1.  He has been brought back to discuss.  He will need chol treatment.  He has seen the dietician.  He could be started on metformin and I discussed with him today.   He notes chronic shoulder pain on the left.  Had  a clavicular fx years ago.  Has used his arm recently and noted pain over the shoulder.    Hypertension History:      Positive major cardiovascular risk factors include male age 43 years old or older and hyperlipidemia.  Negative major cardiovascular risk factors include non-tobacco-user status.     Problems Prior to Update: 1)  Glucose Intolerance  (ICD-271.3) 2)  Hyperglycemia  (ICD-790.29) 3)  Sleep Apnea  (ICD-780.57) 4)  Hemorrhoids, External  (ICD-455.3) 5)  Preventive Health Care  (ICD-V70.0) 6)  Dyspnea  (ICD-786.05) 7)  Family History Diabetes 1st Degree Relative  (ICD-V18.0) 8)  Fatty Liver Disease  (ICD-571.8) 9)   Ulcerative Colitis  (ICD-556.9) 10)  Diverticulosis, Colon  (ICD-562.10) 11)  Osteoarthritis  (ICD-715.90) 12)  Gerd  (ICD-530.81)  Current Medications (verified): 1)  Nexium 40 Mg Cpdr (Esomeprazole Magnesium) .... Take 1 Tablet By Mouth Once A Day 2)  Anusol-Hc 25 Mg Supp (Hydrocortisone Acetate) .Marland Kitchen.. 1 Rectally Three Times A Day As Needed For Hemorrhoids (Please Write in Spanish) 3)  Fish Oil 1000 Mg Caps (Omega-3 Fatty Acids) .... 3 By Mouth Once Daily  Allergies (verified): No Known Drug Allergies  Past History:  Past Medical History: Diverticulosis Ulcerative Colitis    a.  colonoscopy June 2009 c/w diverticulosis and ulcerative colitis - proctosigmoiditis    b.  had burning and rectal bleeding which prompted work up    c.  colo done 11/2009:  mild proctitis in rectum; otherwise normal colo. Fatty Liver on u/s 01/2008 (u/s o/w normal at that time) GERD Mild dyslipidemia in past Intestinal parasite infection treated over last 20 years Osteoarthritis Echo 08/02/2009: Normal LVF; Grade 1 Diast. Dysfxn; no valve issues Sleep Apnea   a.  Mod. OSA by sleep study 09/08/2009; AHI 18.3 per hour; O2 sat nadir of 88%  Past Surgical History: umbilical hernia repair s/p left shoulder surgery 2/2 left clavicular fx   Review of  Systems CV:  Denies chest pain or discomfort and shortness of breath with exertion. GI:  Denies indigestion.  Physical Exam  General:  alert, well-developed, and well-nourished.   Head:  normocephalic and atraumatic.   Neck:  supple.   Lungs:  normal breath sounds.   Heart:  normal rate and regular rhythm.   Abdomen:  soft, non-tender, and no hepatomegaly.   Neurologic:  alert & oriented X3 and cranial nerves II-XII intact.   Psych:  normally interactive.     Impression & Recommendations:  Problem # 1:  GLUCOSE INTOLERANCE (ICD-271.3) repeat labs today consider putting him on metformin if his A1C stays above 6 start ASA once daily  Orders: T-  Hemoglobin A1C (62130-86578) Capillary Blood Glucose/CBG (46962) T-Comprehensive Metabolic Panel (95284-13244)  Problem # 2:  HYPERLIPIDEMIA (ICD-272.4)  with fatty liver and glucose intolerance, start on statin  His updated medication list for this problem includes:    Pravastatin Sodium 40 Mg Tabs (Pravastatin sodium) .Marland Kitchen... Take 1 tab by mouth at bedtime for cholesterol (write in spanish)  Orders: T-Comprehensive Metabolic Panel ([address]27-25366)  Problem # 3:  SLEEP APNEA (ICD-780.57) never got referral for CPAP will submit again  Problem # 4:  ULCERATIVE COLITIS (ICD-556.9) had mild proctitis on colo with Dr. Leone Payor only needs occ supp as needed  Problem # 5:  GERD (ICD-530.81) stable on meds   His updated medication list for this problem includes:    Nexium 40 Mg Cpdr (Esomeprazole magnesium) .Marland Kitchen... Take 1 tablet by mouth once a day  Problem # 6:  FATTY LIVER DISEASE (ICD-571.8) start statin consider metformin  Complete Medication List: 1)  Nexium 40 Mg Cpdr (Esomeprazole magnesium) .... Take 1 tablet by mouth once a day 2)  Anusol-hc 25 Mg Supp (Hydrocortisone acetate) .Marland Kitchen.. 1 rectally three times a day as needed for hemorrhoids (please write in spanish) 3)  Fish Oil 1000 Mg Caps (Omega-3 fatty acids) .... 3 by mouth once daily 4)  Pravastatin Sodium 40 Mg Tabs (Pravastatin sodium) .... Take 1 tab by mouth at bedtime for cholesterol (write in spanish) 5)  Aspirin 81 Mg Tabs (Aspirin) .... Take 1 tablet by mouth once a day  Hypertension Assessment/Plan:      The patient's hypertensive risk group is category B: At least one risk factor (excluding diabetes) with no target organ damage.  His calculated 10 year risk of coronary heart disease is 11 %.  Today's blood pressure is 115/72.    Patient Instructions: 1)  Try to rest arm for a few days. 2)  You may use tylenol or ibuprofen as needed for pain. 3)  Take 650 - 1000 mg of tylenol every 4-6 hours as needed for relief of  pain or comfort of fever. Avoid taking more than 4000 mg in a 24 hour period( can cause liver damage in higher doses).  4)  Take 400-600 mg of Ibuprofen (Advil, Motrin) with food every 4-6 hours as needed  for relief of pain or comfort of fever.  5)  Start taking Pravastatin every day for cholesterol. 6)  Start taking Aspirin 81 mg once daily. 7)  Schedule fasting lipids and LFTs in 3 months. 8)  Please schedule a follow-up appointment in 4 months with Eisa Conaway for sugars.  Prescriptions: PRAVASTATIN SODIUM 40 MG TABS (PRAVASTATIN SODIUM) Take 1 tab by mouth at bedtime for cholesterol (write in Spanish)  #30 x 5   Entered and Authorized by:   Tereso Newcomer PA-C   Signed by:  Tereso Newcomer PA-C on 02/26/2010   Method used:   Print then Give to Patient   RxID:   [id number]

## 2010-09-18 NOTE — Letter (Signed)
Summary: REFERRAL//HOME HEALTH//[facility]  REFERRAL//HOME HEALTH//[facility]   Imported By: Arta Bruce 03/01/2010 09:33:29  _____________________________________________________________________  External Attachment:    Type:   Image     Comment:   External Document

## 2010-09-18 NOTE — Progress Notes (Signed)
Summary: [facility] cannot reach to set up CPAP . . .due to wrong ph #  Phone Note From Other Clinic   Caller: Referral Coordinator Summary of Call: [facility] CALL REGARDING MR Eveland FOR HIS CPAC THEY BEING TRYING TO CALL HIM PHONE #DISC AND ALSO THEY MAILED 2 LETTERS FIRST ONE  03-01-10 AND THE OTHER ONE 03-07-10 . Initial call taken by: Cheryll Dessert,  March 08, 2010 3:58 PM  Follow-up for Phone Call        telephone number is not valid.... i will mail letter too.. Follow-up by: Armenia Shannon,  March 09, 2010 3:44 PM

## 2010-09-18 NOTE — Letter (Signed)
Summary: Patient Notice- Colon Biospy Results  [facility]  [address] [place], [place] [address]   Phone: [phone]  Fax: [phone]        December 06, 2009 MRN: [id number]    [facility] [address] [place], [place]  [address]    Dear Mr. Gravette,  I am pleased to inform you that the biopsies taken during your recent colonoscopy did not show any evidence of cancer upon pathologic examination. there was slight inflammation in the rectum. it appears that you have had some intermittent proctitis. Use of hydrocortisone suppositories on an intermittent basis should help with this. Alternatively, Canasa 100mg  suppositories could be used and prescribed by me or your primary care provider.   Additional information/recommendations:  No further action is needed at this time.  Please follow-up with      your primary care physician for your other healthcare needs.  You should have a repeat colonoscopy examination for colorectal cancer screening in 10 years.  Please call us if you are having persistent problems or have questions about your condition that have not been fully answered at this time.  Sincerely,  Iva Boop MD, [facility]   This letter has been electronically signed by your physician.  Appended Document: Patient Notice- Colon Biospy Results letter mailed 4.25.11

## 2010-09-18 NOTE — Letter (Signed)
Summary: [facility] Instructions  [facility]  [address] [place], [place] [address]   Phone: [phone]  Fax: [phone]       Jack [name]    [birth year]    MRN: [id number]      Procedure Day Dorna Bloom: Farrell Ours, 12/01/09     Arrival Time: 10:30 AM      Procedure Time: 11:30 AM    Location of Procedure:                    _X_  [facility] (4th Floor)                      PREPARATION FOR COLONOSCOPY WITH MOVIPREP   Starting 5 days prior to your procedure TODAY do not eat nuts, seeds, popcorn, corn, beans, peas,  salads, or any raw vegetables.  Do not take any fiber supplements (e.g. Metamucil, Citrucel, and Benefiber).  THE DAY BEFORE YOUR PROCEDURE         THURSDAY, 11/30/09  1.  Drink clear liquids the entire day-NO SOLID FOOD  2.  Do not drink anything colored red or purple.  Avoid juices with pulp.  No orange juice.  3.  Drink at least 64 oz. (8 glasses) of fluid/clear liquids during the day to prevent dehydration and help the prep work efficiently.  CLEAR LIQUIDS INCLUDE: Water Jello Ice Popsicles Tea (sugar ok, no milk/cream) Powdered fruit flavored drinks Coffee (sugar ok, no milk/cream) Gatorade Juice: apple, white grape, white cranberry  Lemonade Clear bullion, consomm, broth Carbonated beverages (any kind) Strained chicken noodle soup Hard Candy                             4.  In the morning, mix first dose of MoviPrep solution:    Empty 1 Pouch A and 1 Pouch B into the disposable container    Add lukewarm drinking water to the top line of the container. Mix to dissolve    Refrigerate (mixed solution should be used within 24 hrs)  5.  Begin drinking the prep at 5:00 p.m. The MoviPrep container is divided by 4 marks.   Every 15 minutes drink the solution down to the next mark (approximately 8 oz) until the full liter is complete.   6.  Follow completed prep with 16 oz of clear liquid of your choice (Nothing red or purple).   Continue to drink clear liquids until bedtime.  7.  Before going to bed, mix second dose of MoviPrep solution:    Empty 1 Pouch A and 1 Pouch B into the disposable container    Add lukewarm drinking water to the top line of the container. Mix to dissolve    Refrigerate  THE DAY OF YOUR PROCEDURE      FRIDAY, 12/01/09  Beginning at 6:30a.m. (5 hours before procedure):         1. Every 15 minutes, drink the solution down to the next mark (approx 8 oz) until the full liter is complete.  2. Follow completed prep with 16 oz. of clear liquid of your choice.    3. You may drink clear liquids until 9:30 AM (2 HOURS BEFORE PROCEDURE).  MEDICATION INSTRUCTIONS  Unless otherwise instructed, you should take regular prescription medications with a small sip of water   as early as possible the morning of your procedure.       OTHER INSTRUCTIONS  You  will need a responsible adult at least 64 years of age to accompany you and drive you home.   This person must remain in the waiting room during your procedure.  Wear loose fitting clothing that is easily removed.  Leave jewelry and other valuables at home.  However, you may wish to bring a book to read or  an iPod/MP3 player to listen to music as you wait for your procedure to start.  Remove all body piercing jewelry and leave at home.  Total time from sign-in until discharge is approximately 2-3 hours.  You should go home directly after your procedure and rest.  You can resume normal activities the  day after your procedure.  The day of your procedure you should not:   Drive   Make legal decisions   Operate machinery   Drink alcohol   Return to work  You will receive specific instructions about eating, activities and medications before you leave.  The above instructions have been reviewed and explained to me by   [facility], CMA   I fully understand and can verbalize these instructions _____________________________ Date  _________

## 2010-09-18 NOTE — Letter (Signed)
Summary: REFERRAL/SLEEP STUDY  REFERRAL/SLEEP STUDY   Imported By: Arta Bruce 09/26/2009 15:38:29  _____________________________________________________________________  External Attachment:    Type:   Image     Comment:   External Document

## 2010-09-18 NOTE — Letter (Signed)
Summary: [facility]  [facility]   Imported By: Arta Bruce 07/11/2009 16:51:18  _____________________________________________________________________  External Attachment:    Type:   Image     Comment:   External Document

## 2010-09-18 NOTE — Assessment & Plan Note (Signed)
Summary: NEW GCCN/HEART ATTACKS/?SLEEP APENIA//KT   Vital Signs:  Patient profile:   65 year old male Height:      65.25 inches Weight:      193.6 pounds BMI:     32.09 Temp:     98.4 degrees F oral Pulse rate:   81 / minute Pulse rhythm:   regular Resp:     20 per minute BP sitting:   109 / 71  (left arm) Cuff size:   large  Vitals Entered By: [place] Shannon (June 14, 2009 4:04 PM) CC: NP...Marland KitchenMarland Kitchen pt says he has been having problem with his stomach.. pt says he has SOB.....Marland Kitchen pt says it feels like heartburn.Marland KitchenMarland KitchenMarland KitchenMarland Kitchenpt tried Nexium and it worked and pt would like to know if he can have another refill..... .. pt says he has inflammation in his left shoulder Is Patient Diabetic? No  Does patient need assistance? Functional Status Self care Ambulation Normal   CC:  NP...Marland KitchenMarland Kitchen pt says he has been having problem with his stomach.. pt says he has SOB.....Marland Kitchen pt says it feels like heartburn.Marland KitchenMarland KitchenMarland KitchenMarland Kitchenpt tried Nexium and it worked and pt would like to know if he can have another refill..... .. pt says he has inflammation in his left shoulder.  History of Present Illness: New patient.  From [employer].    CC:  dyspnea.  Notes occ. for last 2 weeks.  Notes while working.  Feels like it comes on with exetion.  NYHA 2.  No symptoms at rest.  Does note occ. chest discomfort.  Not related to exertion.  No pain in arm or jaws.  No nausea.  No diaphoresis.  No syncope.  Notes SOB with lying down.  Sleeps on one pillow.  No PND.  Wakes up and feels like something in throat and has to drink water.  Notes some edema after sitting for prolonged periods.   no cough no wheezing + snore no witnessed apneic episodes + daytime hypersomnolence Later in questioning, patient notes that most symptoms at night when he lies down.  He seems to be describing apneic episodes.   Habits & Providers  Alcohol-Tobacco-Diet     Alcohol drinks/day: <1     Tobacco Status: quit     Year Quit: 2005     Pack years: <10 pack  years  Exercise-Depression-Behavior     Drug Use: never  Past History:  Past Medical History: Diverticulosis Ulcerative Colitis    a.  colonoscopy June 2009 c/w diverticulosis and ulcerative colitis    b.  had burning and rectal bleeding which prompted work up Fatty Liver on u/s 01/2008 (u/s o/w normal at that time) GERD Mild dyslipidemia in past Intestinal parasite infection treated over last 20 years Osteoarthritis  Past Surgical History: umbilical hernia repair  Family History: No heart disease Family History Diabetes 1st degree relative (dad) Uterine CA - mom  Social History: From [place] IN [place] x 7 years Former Smoker Married Smoking Status:  quit Drug Use:  never  Review of Systems  The patient denies fever, prolonged cough, hemoptysis, melena, hematochezia, and severe indigestion/heartburn.    Physical Exam  General:  alert, well-developed, and well-nourished.   Head:  normocephalic and atraumatic.   Eyes:  pupils equal, pupils round, pupils reactive to light, and no injection.   Ears:  R ear normal and L ear normal.   Nose:  no external deformity.   Mouth:  pharynx pink and moist, no erythema, and no exudates.   Neck:  supple, no thyromegaly,  no JVD, no carotid bruits, and no cervical lymphadenopathy.   Lungs:  normal breath sounds, no crackles, and no wheezes.   Heart:  normal rate, regular rhythm, no murmur, and no gallop.   Abdomen:  soft, non-tender, normal bowel sounds, no masses, no hepatomegaly, and no splenomegaly.   Pulses:  2+ DP/PT bilat Extremities:  no edema Neurologic:  alert & oriented X3 and cranial nerves II-XII intact.   Skin:  turgor normal.   Psych:  good eye contact.     Impression & Recommendations:  Problem # 1:  DYSPNEA (ICD-786.05)  suspect he is describing sleep apnea check ECG check Echo check CXR consider PFTs with smoking hx (although, no cough or wheeze) check labs  Orders: T-Comprehensive Metabolic Panel  ([address]) T-CBC w/Diff (09811-91478) T-TSH (29562-13086) T-BNP  (B Natriuretic Peptide) (57846-96295) 2 D Echo (2 D Echo) Diagnostic X-Ray/Fluoroscopy (Diagnostic X-Ray/Flu) Sleep Disorder Referral (Sleep Disorder)  Problem # 2:  GERD (ICD-530.81) controlled on nexium  His updated medication list for this problem includes:    Nexium 40 Mg Cpdr (Esomeprazole magnesium) .Marland Kitchen... Take 1 tablet by mouth once a day  Problem # 3:  PREVENTIVE HEALTH CARE (ICD-V70.0) patient has h/o parasite infection in past he will bring in name of med he has taken  Problem # 4:  ULCERATIVE COLITIS (ICD-556.9) patient brought in records from [place] he may have taken something for the colitis, but no longer on anything no diarrhea or BRBPR will try to get better interpretation of records  Complete Medication List: 1)  Nexium 40 Mg Cpdr (Esomeprazole magnesium) .... Take 1 tablet by mouth once a day  Patient Instructions: 1)  Please schedule a follow-up appointment in 1 month or sooner if needed with Scott for shortness of breath.    EKG  Procedure date:  06/14/2009  Findings:      NSR HR 75 Leftward axis no isch changes ? RAE

## 2010-09-18 NOTE — Letter (Signed)
Summary: *HSN Results Follow up  [facility]  [address] [place], [place] [address]   Phone: [phone]  Fax: [phone]      06/06/2010   [facility] [address] [place], [place]  [address]   Dear  Mr. Bernerd Schenk,                            ____S.Drinkard,FNP   ____D. Gore,FNP       ____B. McPherson,MD   ____V. Rankins,MD    ____E. Mulberry,MD    ____N. Daphine Deutscher, FNP  ____D. Reche Dixon, MD    ____K. Philipp Deputy, MD    ____Other     This letter is to inform you that your recent test(s):  _______Pap Smear    ___X____Lab Test     _______X-ray    _______ is within acceptable limits  ___X____ requires a medication change  _______ requires a follow-up lab visit  _______ requires a follow-up visit with your Johannes Everage   Comments:  We have been trying to reach you.  Please give the office a call at your earliest convenience.       _________________________________________________________ If you have any questions, please contact our office                     Sincerely,  Arm[place] Shannon [facility]

## 2010-09-18 NOTE — Letter (Signed)
Summary: New Patient letter  [facility]  [address] [place], [place] [address]   Phone: [phone]  Fax: [phone]       10/25/2009 MRN: [id number]  [facility] [address] [place], [place]  [address]  Dear Jack [name],  Welcome to the Gastroenterology Division at [facility].    You are scheduled to see Dr. Leone Payor on 11/27/2009 at 8:45AM on the 3rd floor at [facility], [address]  We ask that you try to arrive at our office 15 minutes prior to your appointment time to allow for check-in.  We would like you to complete the enclosed self-administered evaluation form prior to your visit and bring it with you on the day of your appointment.  We will review it with you.  Also, please bring a complete list of all your medications or, if you prefer, bring the medication bottles and we will list them.  Please bring your insurance card so that we may make a copy of it.  If your insurance requires a referral to see a specialist, please bring your referral form from your primary care physician.  Co-payments are due at the time of your visit and may be paid by cash, check or credit card.     Your office visit will consist of a consult with your physician (includes a physical exam), any laboratory testing he/she may order, scheduling of any necessary diagnostic testing (e.g. x-ray, ultrasound, CT-scan), and scheduling of a procedure (e.g. Endoscopy, Colonoscopy) if required.  Please allow enough time on your schedule to allow for any/all of these possibilities.    If you cannot keep your appointment, please call [phone] to cancel or reschedule prior to your appointment date.  This allows [place] the opportunity to schedule an appointment for another patient in need of care.  If you do not cancel or reschedule by 5 p.m. the business day prior to your appointment date, you will be charged a $50.00 late cancellation/no-show fee.    Thank you for choosing  [facility] for your medical needs.  We appreciate the opportunity to care for you.  Please visit [place] at our website  to learn more about our practice.                     Sincerely,                                                             The Gastroenterology Division

## 2010-09-20 NOTE — Assessment & Plan Note (Signed)
Summary: f/u on sugars////cns   Vital Signs:  Patient profile:   65 year old male Weight:      186.50 pounds Temp:     98.2 degrees F oral Pulse rate:   76 / minute Pulse rhythm:   regular Resp:     18 per minute BP sitting:   106 / 76  (right arm) Cuff size:   regular  Vitals Entered By: Hale Drone CMA (August 14, 2010 4:15 PM) CC: f/u from 02/26/10 for sugar.  Is Patient Diabetic? No Pain Assessment Patient in pain? no      CBG Result 100 CBG Device ID A non fasting  Does patient need assistance? Functional Status Self care Ambulation Normal   Primary Care Falecia Vannatter:  Tereso Newcomer PA-C  CC:  f/u from 02/26/10 for sugar. Marland Kitchen  History of Present Illness: 1.  Glucose intolerance:  A1C remains at 5.8%.  Controlling with diet.    2.  Hyperlipidemia:  Taking Pravastatin and Fish oil capsules.  Last check was better.  Has tried to change diet and is physically active.    3.  2 weeks ago, developed numbness in right low flank--constant now.  Has an area of redness in the area of numbness that itches.  Not painful.  4.  Numbness in anterolateral thighs bilaterally if standing for a period of time.  Pt. admits later that those areas are always numb, but worse when standing.  Does have a history of injury to low back when young man.  Not sure if numbness in thighs is worse when back bothers him.  Had xrays of back only when a young man/boy.  Current Medications (verified): 1)  Omeprazole 20 Mg Cpdr (Omeprazole) .... Take 2 Caps Daily 2)  Anusol-Hc 25 Mg Supp (Hydrocortisone Acetate) .Marland Kitchen.. 1 Rectally Three Times A Day As Needed For Hemorrhoids (Please Write in Spanish) 3)  Fish Oil 1000 Mg Caps (Omega-3 Fatty Acids) .... 3 By Mouth Once Daily 4)  Pravastatin Sodium 40 Mg Tabs (Pravastatin Sodium) .... Take 1 Tab By Mouth At Bedtime For Cholesterol (Write in Spanish) 5)  Aspirin 81 Mg Tabs (Aspirin) .... Take 1 Tablet By Mouth Once A Day  Allergies (verified): No Known Drug  Allergies  Physical Exam  Lungs:  Normal respiratory effort, chest expands symmetrically. Lungs are clear to auscultation, no crackles or wheezes. Heart:  Normal rate and regular rhythm. S1 and S2 normal without gallop, murmur, click, rub or other extra sounds. Abdomen:  Somewhat protuberant abdomen overlying inguinal area bilaterally with sitting. Neurologic:  alert & oriented X3, cranial nerves II-XII intact, strength normal in all extremities, and gait normal.  Decreased pinprick sensation to right anterolateral thigh>>left anterolateral thigh.   Skin:  right low lateral flank with decreased sensation to light touch.  Cluster of opened vesicle on erythematous base--quarter size within dermatome   Impression & Recommendations:  Problem # 1:  HERPES ZOSTER (ICD-053.9) Valtrex Discussed obtaining capsaicin as well. To call if worsens, but sounds like this is improving.  Problem # 2:  HYPERLIPIDEMIA (ICD-272.4) Improving with treatment, but not quite at goal and has fatty liver Increase Pravastatin to 60 mg  His updated medication list for this problem includes:    Pravastatin Sodium 40 Mg Tabs (Pravastatin sodium) .Marland Kitchen... 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 Take 2 caps daily 2)  Anusol-hc 25 Mg Supp (Hydrocortisone acetate) .Marland Kitchen.. 1 rectally three times a day as needed for hemorrhoids (please write in spanish) 3)  Fish Oil 1000 Mg Caps (Omega-3 fatty acids) .... 3 by mouth once daily 4)  Pravastatin Sodium 40 Mg Tabs (Pravastatin sodium) .Marland Kitchen.. 1 1/2 tabs by mouth at bedtime for cholesterol (write in spanish) 5)  Aspirin 81 Mg Tabs (Aspirin) .... Take 1 tablet by  mouth once a day 6)  Valtrex 1 Gm Tabs (Valacyclovir hcl) .Marland Kitchen.. 1 tab by mouth three times a day for 7 days  Other Orders: Capillary Blood Glucose/CBG (30865)  Patient Instructions: 1)  Lab visit for FLP/liver profile in 2 months. 2)  Capsaicin cream for rash on right side 3)  Follow up with Dr. Delrae Alfred in 4 months  Prescriptions: ANUSOL-HC 25 MG SUPP (HYDROCORTISONE ACETATE) 1 rectally three times a day as needed for hemorrhoids (please write in Spanish)  #12 x 3   Entered and Authorized by:   Julieanne Manson MD   Signed by:   Julieanne Manson MD on 08/14/2010   Method used:   Faxed to ...       [facility] (retail)       [address] [place], [place]  [address]       Ph: [phone]       Fax: [phone]   RxID:   [id number] VALTREX 1 GM TABS (VALACYCLOVIR HCL) 1 tab by mouth three times a day for 7 days  #21 x 0   Entered and Authorized by:   Julieanne Manson MD   Signed by:   Julieanne Manson MD on 08/14/2010   Method used:   Faxed to ...       [facility] (retail)       [address] [place], [place]  [address]       Ph: [phone]       Fax: [phone]   RxID:   [id number] PRAVASTATIN SODIUM 40 MG TABS (PRAVASTATIN SODIUM) 1 1/2 tabs by mouth at bedtime for cholesterol (write in Spanish)  #45 x 11   Entered and Authorized by:   Julieanne Manson MD   Signed by:   Julieanne Manson MD on 08/14/2010   Method used:   Faxed to ...       [facility] (retail)       [address] [place], [place]  [address]       Ph: [phone]       Fax: [phone]   RxID:   [id number]    Orders Added: 1)  Capillary Blood Glucose/CBG [82948] 2)  Est. Patient Level IV [937[address]] 3)  Hemoglobin A1C [83036]   Not Administered:    Influenza Vaccine not given due to: declined    Laboratory Results   Blood  Tests   Date/Time Received: August 14, 2010 5:39 PM   HGBA1C: 5.8%   (Normal Range: Non-Diabetic - 3-6%   Control Diabetic - 6-8%) CBG Random:: 100

## 2010-10-12 ENCOUNTER — Encounter (INDEPENDENT_AMBULATORY_CARE_PROVIDER_SITE_OTHER): Payer: Self-pay | Admitting: Nurse Practitioner

## 2010-10-17 LAB — CONVERTED CEMR LAB
ALT: 28 units/L (ref 0–53)
AST: 20 units/L (ref 0–37)
Albumin: 4.3 g/dL (ref 3.5–5.2)
Alkaline Phosphatase: 85 units/L (ref 39–117)
Bilirubin, Direct: 0.1 mg/dL (ref 0.0–0.3)
Cholesterol: 236 mg/dL — ABNORMAL HIGH (ref 0–200)
HDL: 54 mg/dL (ref >39)
Indirect Bilirubin: 0.6 mg/dL (ref 0.0–0.9)
LDL Cholesterol: 124 mg/dL — ABNORMAL HIGH (ref 0–99)
Total Bilirubin: 0.7 mg/dL (ref 0.3–1.2)
Total CHOL/HDL Ratio: 4.4
Total Protein: 7.7 g/dL (ref 6.0–8.3)
Triglycerides: 288 mg/dL — ABNORMAL HIGH (ref <150)
VLDL: 58 mg/dL — ABNORMAL HIGH (ref 0–40)

## 2010-10-22 ENCOUNTER — Encounter (INDEPENDENT_AMBULATORY_CARE_PROVIDER_SITE_OTHER): Payer: Self-pay | Admitting: *Deleted

## 2010-10-30 NOTE — Letter (Addendum)
Summary: *HSN Results Follow up  [facility]  [address] [place], [place] [address]   Phone: [phone]  Fax: [phone]      10/22/2010   [facility] [address] [place], [place]  [address]   Dear  Mr. Raesean Pugh,                            ____S.Drinkard,FNP   ____D. Gore,FNP       ____B. McPherson,MD   ____V. Rankins,MD    ____E. Mulberry,MD    ____N. Daphine Deutscher, FNP  ____D. Reche Dixon, MD    ____K. Philipp Deputy, MD    ____Other     This letter is to inform you that your recent test(s):  _______Pap Smear    _______Lab Test     _______X-ray    _______ is within acceptable limits  _______ requires a medication change  _______ requires a follow-up lab visit  _______ requires a follow-up visit with your provider   Comments: We have been trying to reach you at [phone].  Please contact the office at your earliest convenience.      _________________________________________________________ If you have any questions, please contact our office                     Sincerely,  Levon Hedger [facility]

## 2010-11-06 ENCOUNTER — Telehealth (INDEPENDENT_AMBULATORY_CARE_PROVIDER_SITE_OTHER): Payer: Self-pay | Admitting: Internal Medicine

## 2010-11-15 NOTE — Progress Notes (Signed)
Summary: Pravastatin transferred to [facility]  Phone Note Outgoing Call   Summary of Call: Refill for pravastatin 07/2010- has plenty of refills available through [facility].  Faxed note to [facility] to call [place] pharmacy to transfer medication. Initial call taken by: Dutch Quint RN,  November 06, 2010 5:26 PM

## 2010-12-27 ENCOUNTER — Ambulatory Visit: Payer: Self-pay | Attending: Internal Medicine | Admitting: Physical Therapy

## 2011-07-12 ENCOUNTER — Emergency Department (INDEPENDENT_AMBULATORY_CARE_PROVIDER_SITE_OTHER)
Admission: EM | Admit: 2011-07-12 | Discharge: 2011-07-12 | Disposition: A | Discharge status: Home / Self Care | Payer: Self-pay | Source: Home / Self Care | Attending: Emergency Medicine | Admitting: Emergency Medicine

## 2011-07-12 DIAGNOSIS — L02419 Cutaneous abscess of limb, unspecified: Secondary | ICD-10-CM

## 2011-07-12 MED ORDER — SULFAMETHOXAZOLE-TRIMETHOPRIM 800-160 MG PO TABS: 1.0000 | ORAL_TABLET | Freq: Two times a day (BID) | ORAL | Status: AC

## 2011-07-12 NOTE — ED Provider Notes (Signed)
History     CSN: [id number] Arrival date & time: 07/12/2011  3:11 PM   First MD Initiated Contact with Patient 07/12/11 1430      Chief Complaint  Patient presents with  . Wound Infection    (Consider location/radiation/quality/duration/timing/severity/associated sxs/prior treatment) Patient is a 65 y.o. male presenting with abscess.  Abscess  This is a new problem. The current episode started less than one week ago. The onset was gradual. The problem occurs continuously. The problem has been unchanged. The abscess is present on the left lower leg. The problem is moderate. The abscess is characterized by redness, painfulness and draining. The patient was exposed to antibiotics (pt had antibiotics from something else; has been taking). Pertinent negatives include no fever.    History reviewed. No pertinent past medical history.  History reviewed. No pertinent past surgical history.  History reviewed. No pertinent family history.  History  Substance Use Topics  . Smoking status: Never Smoker   . Smokeless tobacco: Not on file  . Alcohol Use: Yes      Review of Systems  Constitutional: Negative for fever.  Skin:       Abscess  Neurological: Negative for weakness.    Allergies  Review of patient's allergies indicates no known allergies.  Home Medications   Current Outpatient Rx  Name Route Sig Dispense Refill  . NAPROXEN 500 MG PO TABS Oral Take 500 mg by mouth 2 (two) times daily with a meal.      . SULFAMETHOXAZOLE-TRIMETHOPRIM 800-160 MG PO TABS Oral Take 1 tablet by mouth every 12 (twelve) hours. 20 tablet 0    BP 154/88  Pulse 71  Temp(Src) 98.9 F (37.2 C) (Oral)  Resp 16  SpO2 98%  Physical Exam  Constitutional: He appears well-developed and well-nourished. No distress.  Pulmonary/Chest: Effort normal.  Skin:       ED Course  INCISION AND DRAINAGE Date/Time: 07/12/2011 4:00 PM Performed by: Cathlyn Parsons Authorized by: Luiz Blare Consent: Verbal consent obtained. Written consent not obtained. Consent given by: patient Patient understanding: patient states understanding of the procedure being performed Type: abscess Body area: lower extremity Location details: left leg Anesthesia: local infiltration Local anesthetic: lidocaine 2% without epinephrine Anesthetic total: 1 ml Patient sedated: no Scalpel size: 11 Incision type: single straight Complexity: simple Drainage: purulent Drainage amount: scant Wound treatment: wound left open Packing material: 1/4 in iodoform gauze Patient tolerance: Patient tolerated the procedure well with no immediate complications.   (including critical care time)  Labs Reviewed - No data to display No results found.   1. Abscess of leg       MDM          Cathlyn Parsons, NP 07/12/11 1629

## 2011-07-12 NOTE — ED Notes (Signed)
Pain in his leg for 1 week w swollen lesion to calf, had similar one in axilla last week, resolved

## 2011-07-15 NOTE — ED Provider Notes (Signed)
Medical screening examination/treatment/procedure(s) were performed by non-physician practitioner and as supervising physician I was immediately available for consultation/collaboration.  Loda Bialas M. MD   Aodhan Scheidt M Yitzhak Awan, MD 07/15/11 0857 

## 2011-09-15 ENCOUNTER — Encounter (HOSPITAL_COMMUNITY): Payer: Self-pay | Admitting: *Deleted

## 2011-09-15 ENCOUNTER — Emergency Department (INDEPENDENT_AMBULATORY_CARE_PROVIDER_SITE_OTHER)
Admission: EM | Admit: 2011-09-15 | Discharge: 2011-09-15 | Disposition: A | Discharge status: Home / Self Care | Payer: Self-pay | Source: Home / Self Care | Attending: Emergency Medicine | Admitting: Emergency Medicine

## 2011-09-15 DIAGNOSIS — L0231 Cutaneous abscess of buttock: Secondary | ICD-10-CM

## 2011-09-15 DIAGNOSIS — L03317 Cellulitis of buttock: Secondary | ICD-10-CM

## 2011-09-15 HISTORY — DX: Cutaneous abscess, unspecified: L02.91

## 2011-09-15 HISTORY — DX: Pure hypercholesterolemia, unspecified: E78.00

## 2011-09-15 MED ORDER — CHLORHEXIDINE GLUCONATE 4 % EX LIQD: 60.0000 mL | Freq: Every day | CUTANEOUS | Status: AC | PRN

## 2011-09-15 MED ORDER — BACITRACIN 500 UNIT/GM EX OINT: 1.0000 "application " | TOPICAL_OINTMENT | Freq: Once | CUTANEOUS | Status: DC

## 2011-09-15 MED ORDER — HYDROCODONE-ACETAMINOPHEN 5-325 MG PO TABS: 2.0000 | ORAL_TABLET | ORAL | Status: AC | PRN

## 2011-09-15 MED ORDER — DOXYCYCLINE HYCLATE 100 MG PO CAPS: 100.0000 mg | ORAL_CAPSULE | Freq: Two times a day (BID) | ORAL | Status: AC

## 2011-09-15 NOTE — ED Notes (Signed)
Abscess right buttock onset 4 to 5 days - painful - history of abscess

## 2011-09-15 NOTE — ED Provider Notes (Signed)
History     CSN: [id number]  Arrival date & time 09/15/11  1754   First MD Initiated Contact with Patient 09/15/11 1827      Chief Complaint  Patient presents with  . Abscess    (Consider location/radiation/quality/duration/timing/severity/associated sxs/prior treatment) HPI Comments: Patient with history of recurrent MRSA skin infections presents with 4 days of a painful, erythematous mass of gradually increasing size over his right buttock. No drainage, fevers, nausea, vomiting. Doesn't recall trauma to the area. Hasn't tried anything for this. Per chart review, no history of diabetes. Similar presentation for abscess that required I&D in November 2011.   ROS as noted in HPI. All other ROS negative.   Patient is a 66 y.o. male presenting with abscess. The history is provided by the patient and medical records. No language interpreter was used.  Abscess  This is a recurrent problem. The current episode started less than one week ago. The problem has been gradually worsening. The abscess is present on the right buttock.    Past Medical History  Diagnosis Date  . Abscess   . Hypercholesteremia     History reviewed. No pertinent past surgical history.  History reviewed. No pertinent family history.  History  Substance Use Topics  . Smoking status: Never Smoker   . Smokeless tobacco: Not on file  . Alcohol Use: Yes      Review of Systems  Allergies  Review of patient's allergies indicates no known allergies.  Home Medications   Current Outpatient Rx  Name Route Sig Dispense Refill  . NAPROXEN 500 MG PO TABS Oral Take 500 mg by mouth 2 (two) times daily with a meal.      . CHLORHEXIDINE GLUCONATE 4 % EX LIQD Topical Apply 60 mLs (4 application total) topically daily as needed. Use daily for 1-2 weeks 120 mL 0  . DOXYCYCLINE HYCLATE 100 MG PO CAPS Oral Take 1 capsule (100 mg total) by mouth 2 (two) times daily. 20 capsule 0  . HYDROCODONE-ACETAMINOPHEN 5-325 MG PO  TABS Oral Take 2 tablets by mouth every 4 (four) hours as needed for pain. 20 tablet 0    BP 126/81  Pulse 87  Temp(Src) 98.7 F (37.1 C) (Oral)  Resp 20  SpO2 99%  Physical Exam  Nursing note and vitals reviewed. Constitutional: He is oriented to person, place, and time. He appears well-developed and well-nourished.  HENT:  Head: Normocephalic and atraumatic.  Eyes: Conjunctivae and EOM are normal.  Neck: Normal range of motion.  Cardiovascular: Normal rate.   Pulmonary/Chest: Effort normal.  Abdominal: He exhibits no distension.  Musculoskeletal: Normal range of motion.       Legs:      Approximately 3 x 2 cm area of erythema, induration over right buttock. Small amount of central fluctuance. No expressible drainage.  Neurological: He is alert and oriented to person, place, and time.  Skin: Skin is warm and dry.  Psychiatric: He has a normal mood and affect. His behavior is normal.    ED Course  INCISION AND DRAINAGE Date/Time: 09/15/2011 9:43 PM Performed by: Luiz Blare Authorized by: Luiz Blare Consent: Verbal consent obtained. Written consent not obtained. Risks and benefits: risks, benefits and alternatives were discussed Consent given by: patient Patient understanding: patient states understanding of the procedure being performed Patient consent: the patient's understanding of the procedure matches consent given Site marked: the operative site was marked Required items: required blood products, implants, devices, and special equipment available Patient identity  confirmed: verbally with patient and arm band Time out: Immediately prior to procedure a "time out" was called to verify the correct patient, procedure, equipment, support staff and site/side marked as required. Type: abscess Body area: trunk (Right buttock) Local anesthetic: topical anesthetic and co-phenylcaine spray Patient sedated: no Scalpel size: 11 Incision type: single  straight Complexity: simple Drainage: purulent and bloody Drainage amount: scant Wound treatment: wound left open Patient tolerance: Patient tolerated the procedure well with no immediate complications. Comments: Covered with bacitracin, sterile pressure dressing applied.   (including critical care time)  Labs Reviewed - No data to display No results found.   1. Abscess of right buttock       MDM    Luiz Blare, MD 09/15/11 2145

## 2011-09-15 NOTE — ED Notes (Signed)
Signature box not working 

## 2013-02-11 ENCOUNTER — Other Ambulatory Visit: Payer: Self-pay | Admitting: Gastroenterology

## 2013-02-11 NOTE — Addendum Note (Signed)
Addended by: Dorena Cookey on: 02/11/2013 10:10 AM   Modules accepted: Orders

## 2013-02-16 ENCOUNTER — Encounter (HOSPITAL_COMMUNITY)
Admission: RE | Disposition: A | Discharge status: Home / Self Care | Payer: Self-pay | Source: Ambulatory Visit | Attending: Gastroenterology

## 2013-02-16 ENCOUNTER — Encounter (HOSPITAL_COMMUNITY): Payer: Self-pay | Admitting: *Deleted

## 2013-02-16 ENCOUNTER — Ambulatory Visit (HOSPITAL_COMMUNITY)
Admission: RE | Admit: 2013-02-16 | Discharge: 2013-02-16 | Disposition: A | Discharge status: Home / Self Care | Payer: No Typology Code available for payment source | Source: Ambulatory Visit | Attending: Gastroenterology | Admitting: Gastroenterology

## 2013-02-16 DIAGNOSIS — Z8711 Personal history of peptic ulcer disease: Secondary | ICD-10-CM | POA: Insufficient documentation

## 2013-02-16 DIAGNOSIS — K625 Hemorrhage of anus and rectum: Secondary | ICD-10-CM | POA: Insufficient documentation

## 2013-02-16 DIAGNOSIS — R197 Diarrhea, unspecified: Secondary | ICD-10-CM | POA: Insufficient documentation

## 2013-02-16 DIAGNOSIS — K449 Diaphragmatic hernia without obstruction or gangrene: Secondary | ICD-10-CM | POA: Insufficient documentation

## 2013-02-16 DIAGNOSIS — E7800 Pure hypercholesterolemia, unspecified: Secondary | ICD-10-CM | POA: Insufficient documentation

## 2013-02-16 DIAGNOSIS — M129 Arthropathy, unspecified: Secondary | ICD-10-CM | POA: Insufficient documentation

## 2013-02-16 DIAGNOSIS — K219 Gastro-esophageal reflux disease without esophagitis: Secondary | ICD-10-CM | POA: Insufficient documentation

## 2013-02-16 DIAGNOSIS — Z79899 Other long term (current) drug therapy: Secondary | ICD-10-CM | POA: Insufficient documentation

## 2013-02-16 HISTORY — PX: COLONOSCOPY: SHX5424

## 2013-02-16 HISTORY — DX: Personal history of peptic ulcer disease: Z87.11

## 2013-02-16 HISTORY — DX: Personal history of other diseases of the digestive system: Z87.19

## 2013-02-16 HISTORY — DX: Unspecified osteoarthritis, unspecified site: M19.90

## 2013-02-16 HISTORY — DX: Gastro-esophageal reflux disease without esophagitis: K21.9

## 2013-02-16 HISTORY — PX: ESOPHAGOGASTRODUODENOSCOPY: SHX5428

## 2013-02-16 SURGERY — EGD (ESOPHAGOGASTRODUODENOSCOPY)
Anesthesia: Moderate Sedation

## 2013-02-16 MED ORDER — SODIUM CHLORIDE 0.9 % IV SOLN
INTRAVENOUS | Status: DC
  Administered 2013-02-16: 500 mL via INTRAVENOUS

## 2013-02-16 MED ORDER — FENTANYL CITRATE 0.05 MG/ML IJ SOLN
INTRAMUSCULAR | Status: DC | PRN
  Administered 2013-02-16 (×3): 25 ug via INTRAVENOUS

## 2013-02-16 MED ORDER — FENTANYL CITRATE 0.05 MG/ML IJ SOLN
INTRAMUSCULAR | Status: AC
  Filled 2013-02-16: qty 4

## 2013-02-16 MED ORDER — DIPHENHYDRAMINE HCL 50 MG/ML IJ SOLN
INTRAMUSCULAR | Status: AC
  Filled 2013-02-16: qty 1

## 2013-02-16 MED ORDER — BUTAMBEN-TETRACAINE-BENZOCAINE 2-2-14 % EX AERO
INHALATION_SPRAY | CUTANEOUS | Status: DC | PRN
  Administered 2013-02-16: 2 via TOPICAL

## 2013-02-16 MED ORDER — MIDAZOLAM HCL 10 MG/2ML IJ SOLN
INTRAMUSCULAR | Status: DC | PRN
  Administered 2013-02-16 (×3): 2 mg via INTRAVENOUS

## 2013-02-16 MED ORDER — MIDAZOLAM HCL 5 MG/ML IJ SOLN
INTRAMUSCULAR | Status: AC
  Filled 2013-02-16: qty 4
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
## 2013-02-16 NOTE — Op Note (Signed)
[facility] [address] [place], [address]   ENDOSCOPY PROCEDURE REPORT  PATIENT: Jack [name], Jack [name]  MR#: [id number] BIRTHDATE: 26-Sep-1945 , 66  yrs. old GENDER: Male ENDOSCOPIST:Ivylynn Hoppes Madilyn Fireman, MD REFERRED BY: PROCEDURE DATE:  02/16/2013 PROCEDURE: ASA CLASS: INDICATIONS:   diarrhea and reflux MEDICATION:    50 mcg fentanyl 5 mg Versed TOPICAL ANESTHETIC:    Cetacaine spray  DESCRIPTION OF PROCEDURE:  esophagus: Small to moderate hiatal hernia Stomach: Normal Duodenum: Normal. Biopsies taken to rule out celiac disease      COMPLICATIONS: None  ENDOSCOPIC IMPRESSION:small hiatal hernia otherwise normal  RECOMMENDATIONS:continued antireflux measures and await duodenal biopsies    _______________________________ Rosalie DoctorDorena Cookey, MD 02/16/2013 9:32 AM

## 2013-02-16 NOTE — Op Note (Signed)
[facility] [address] [place], [address]   COLONOSCOPY PROCEDURE REPORT  PATIENT: Gaurav, Baldree  MR#: [id number] BIRTHDATE: [birth year] , 66  yrs. old GENDER: Male ENDOSCOPIST: Dorena Cookey, MD REFERRED BY: PROCEDURE DATE:  02/16/2013 PROCEDURE: ASA CLASS: INDICATIONS:  rectal bleeding and diarrhea MEDICATIONS:  fentanyl 50 mcg Versed 1 mg  DESCRIPTION OF PROCEDURE: : Completely normal except for small internal hemorrhoids. Biopsies taken of the descending and transverse colon to rule out microscopic colitis.     COMPLICATIONS: None  ENDOSCOPIC IMPRESSION:internal hemorrhoid  RECOMMENDATIONS:await biopsy results    _______________________________ Rosalie DoctorDorena Cookey, MD 02/16/2013 9:47 AM

## 2013-02-17 ENCOUNTER — Encounter (HOSPITAL_COMMUNITY): Payer: Self-pay | Admitting: Gastroenterology

## 2013-08-04 DIAGNOSIS — H833X9 Noise effects on inner ear, unspecified ear: Secondary | ICD-10-CM | POA: Diagnosis not present

## 2013-08-04 DIAGNOSIS — H9110 Presbycusis, unspecified ear: Secondary | ICD-10-CM | POA: Diagnosis not present

## 2013-08-04 DIAGNOSIS — H93299 Other abnormal auditory perceptions, unspecified ear: Secondary | ICD-10-CM | POA: Diagnosis not present

## 2014-06-07 DIAGNOSIS — Z131 Encounter for screening for diabetes mellitus: Secondary | ICD-10-CM | POA: Diagnosis not present

## 2014-06-07 DIAGNOSIS — Z125 Encounter for screening for malignant neoplasm of prostate: Secondary | ICD-10-CM | POA: Diagnosis not present

## 2014-06-07 DIAGNOSIS — E780 Pure hypercholesterolemia: Secondary | ICD-10-CM | POA: Diagnosis not present

## 2014-06-08 DIAGNOSIS — Z23 Encounter for immunization: Secondary | ICD-10-CM | POA: Diagnosis not present

## 2014-06-08 DIAGNOSIS — K219 Gastro-esophageal reflux disease without esophagitis: Secondary | ICD-10-CM | POA: Diagnosis not present

## 2014-06-08 DIAGNOSIS — Z0001 Encounter for general adult medical examination with abnormal findings: Secondary | ICD-10-CM | POA: Diagnosis not present

## 2014-06-08 DIAGNOSIS — E739 Lactose intolerance, unspecified: Secondary | ICD-10-CM | POA: Diagnosis not present

## 2014-07-27 DIAGNOSIS — L02412 Cutaneous abscess of left axilla: Secondary | ICD-10-CM | POA: Diagnosis not present

## 2014-07-27 DIAGNOSIS — J310 Chronic rhinitis: Secondary | ICD-10-CM | POA: Diagnosis not present

## 2014-07-27 DIAGNOSIS — R05 Cough: Secondary | ICD-10-CM | POA: Diagnosis not present

## 2014-09-13 DIAGNOSIS — M25569 Pain in unspecified knee: Secondary | ICD-10-CM | POA: Diagnosis not present

## 2014-09-20 DIAGNOSIS — K625 Hemorrhage of anus and rectum: Secondary | ICD-10-CM | POA: Diagnosis not present

## 2014-10-04 DIAGNOSIS — K30 Functional dyspepsia: Secondary | ICD-10-CM | POA: Diagnosis not present

## 2014-10-04 DIAGNOSIS — M25569 Pain in unspecified knee: Secondary | ICD-10-CM | POA: Diagnosis not present

## 2014-10-04 DIAGNOSIS — R109 Unspecified abdominal pain: Secondary | ICD-10-CM | POA: Diagnosis not present

## 2014-10-11 DIAGNOSIS — M1712 Unilateral primary osteoarthritis, left knee: Secondary | ICD-10-CM | POA: Diagnosis not present

## 2014-11-28 DIAGNOSIS — K529 Noninfective gastroenteritis and colitis, unspecified: Secondary | ICD-10-CM | POA: Diagnosis not present

## 2015-02-01 DIAGNOSIS — K529 Noninfective gastroenteritis and colitis, unspecified: Secondary | ICD-10-CM | POA: Diagnosis not present

## 2015-02-02 DIAGNOSIS — H04123 Dry eye syndrome of bilateral lacrimal glands: Secondary | ICD-10-CM | POA: Diagnosis not present

## 2015-02-10 DIAGNOSIS — I868 Varicose veins of other specified sites: Secondary | ICD-10-CM | POA: Diagnosis not present

## 2015-03-10 ENCOUNTER — Encounter: Payer: Self-pay | Admitting: Internal Medicine

## 2015-08-15 DIAGNOSIS — M19041 Primary osteoarthritis, right hand: Secondary | ICD-10-CM | POA: Diagnosis not present

## 2015-08-15 DIAGNOSIS — E7800 Pure hypercholesterolemia, unspecified: Secondary | ICD-10-CM | POA: Diagnosis not present

## 2015-08-15 DIAGNOSIS — K219 Gastro-esophageal reflux disease without esophagitis: Secondary | ICD-10-CM | POA: Diagnosis not present

## 2015-08-16 DIAGNOSIS — Z79899 Other long term (current) drug therapy: Secondary | ICD-10-CM | POA: Diagnosis not present

## 2015-08-16 DIAGNOSIS — Z125 Encounter for screening for malignant neoplasm of prostate: Secondary | ICD-10-CM | POA: Diagnosis not present

## 2015-08-16 DIAGNOSIS — E7800 Pure hypercholesterolemia, unspecified: Secondary | ICD-10-CM | POA: Diagnosis not present

## 2016-01-22 DIAGNOSIS — E119 Type 2 diabetes mellitus without complications: Secondary | ICD-10-CM | POA: Diagnosis not present

## 2016-01-22 DIAGNOSIS — H04123 Dry eye syndrome of bilateral lacrimal glands: Secondary | ICD-10-CM | POA: Diagnosis not present

## 2016-01-22 DIAGNOSIS — H2513 Age-related nuclear cataract, bilateral: Secondary | ICD-10-CM | POA: Diagnosis not present

## 2016-01-23 DIAGNOSIS — E7800 Pure hypercholesterolemia, unspecified: Secondary | ICD-10-CM | POA: Diagnosis not present

## 2016-01-23 DIAGNOSIS — M79673 Pain in unspecified foot: Secondary | ICD-10-CM | POA: Diagnosis not present

## 2016-01-23 DIAGNOSIS — G629 Polyneuropathy, unspecified: Secondary | ICD-10-CM | POA: Diagnosis not present

## 2016-02-01 DIAGNOSIS — M7732 Calcaneal spur, left foot: Secondary | ICD-10-CM | POA: Diagnosis not present

## 2016-02-01 DIAGNOSIS — M79672 Pain in left foot: Secondary | ICD-10-CM | POA: Diagnosis not present

## 2016-02-01 DIAGNOSIS — M7731 Calcaneal spur, right foot: Secondary | ICD-10-CM | POA: Diagnosis not present

## 2016-02-01 DIAGNOSIS — M722 Plantar fascial fibromatosis: Secondary | ICD-10-CM | POA: Diagnosis not present

## 2016-02-01 DIAGNOSIS — M792 Neuralgia and neuritis, unspecified: Secondary | ICD-10-CM | POA: Diagnosis not present

## 2016-02-01 DIAGNOSIS — M79671 Pain in right foot: Secondary | ICD-10-CM | POA: Diagnosis not present

## 2016-02-01 DIAGNOSIS — G609 Hereditary and idiopathic neuropathy, unspecified: Secondary | ICD-10-CM | POA: Diagnosis not present

## 2016-03-06 DIAGNOSIS — M79671 Pain in right foot: Secondary | ICD-10-CM | POA: Diagnosis not present

## 2016-03-06 DIAGNOSIS — M722 Plantar fascial fibromatosis: Secondary | ICD-10-CM | POA: Diagnosis not present

## 2016-03-06 DIAGNOSIS — M79672 Pain in left foot: Secondary | ICD-10-CM | POA: Diagnosis not present

## 2016-07-08 DIAGNOSIS — M791 Myalgia: Secondary | ICD-10-CM | POA: Diagnosis not present

## 2016-11-07 ENCOUNTER — Encounter: Payer: Self-pay | Admitting: Cardiovascular Disease

## 2016-11-07 DIAGNOSIS — Z79899 Other long term (current) drug therapy: Secondary | ICD-10-CM | POA: Diagnosis not present

## 2016-11-07 DIAGNOSIS — E7800 Pure hypercholesterolemia, unspecified: Secondary | ICD-10-CM | POA: Diagnosis not present

## 2016-11-07 DIAGNOSIS — Z125 Encounter for screening for malignant neoplasm of prostate: Secondary | ICD-10-CM | POA: Diagnosis not present

## 2016-11-11 DIAGNOSIS — E7800 Pure hypercholesterolemia, unspecified: Secondary | ICD-10-CM | POA: Diagnosis not present

## 2016-11-11 DIAGNOSIS — K219 Gastro-esophageal reflux disease without esophagitis: Secondary | ICD-10-CM | POA: Diagnosis not present

## 2016-11-11 DIAGNOSIS — Z0000 Encounter for general adult medical examination without abnormal findings: Secondary | ICD-10-CM | POA: Diagnosis not present

## 2016-12-04 DIAGNOSIS — M25561 Pain in right knee: Secondary | ICD-10-CM | POA: Diagnosis not present

## 2016-12-04 DIAGNOSIS — M7989 Other specified soft tissue disorders: Secondary | ICD-10-CM | POA: Diagnosis not present

## 2016-12-04 DIAGNOSIS — G8911 Acute pain due to trauma: Secondary | ICD-10-CM | POA: Diagnosis not present

## 2017-01-27 DIAGNOSIS — E119 Type 2 diabetes mellitus without complications: Secondary | ICD-10-CM | POA: Diagnosis not present

## 2017-01-27 DIAGNOSIS — H2513 Age-related nuclear cataract, bilateral: Secondary | ICD-10-CM | POA: Diagnosis not present

## 2017-01-27 DIAGNOSIS — H04123 Dry eye syndrome of bilateral lacrimal glands: Secondary | ICD-10-CM | POA: Diagnosis not present

## 2017-01-31 ENCOUNTER — Other Ambulatory Visit: Payer: Self-pay | Admitting: Gastroenterology

## 2017-01-31 DIAGNOSIS — R1013 Epigastric pain: Secondary | ICD-10-CM | POA: Diagnosis not present

## 2017-01-31 DIAGNOSIS — R197 Diarrhea, unspecified: Secondary | ICD-10-CM

## 2017-02-05 ENCOUNTER — Ambulatory Visit
Admission: RE | Admit: 2017-02-05 | Discharge: 2017-02-05 | Disposition: A | Discharge status: Home / Self Care | Payer: Medicare Other | Source: Ambulatory Visit | Attending: Gastroenterology | Admitting: Gastroenterology

## 2017-02-05 DIAGNOSIS — R1013 Epigastric pain: Secondary | ICD-10-CM

## 2017-02-05 DIAGNOSIS — R1010 Upper abdominal pain, unspecified: Secondary | ICD-10-CM | POA: Diagnosis not present

## 2017-02-05 DIAGNOSIS — R197 Diarrhea, unspecified: Secondary | ICD-10-CM

## 2017-02-12 ENCOUNTER — Other Ambulatory Visit: Payer: No Typology Code available for payment source

## 2017-02-28 DIAGNOSIS — R197 Diarrhea, unspecified: Secondary | ICD-10-CM | POA: Diagnosis not present

## 2017-05-19 ENCOUNTER — Emergency Department (HOSPITAL_COMMUNITY)
Admission: EM | Admit: 2017-05-19 | Discharge: 2017-05-20 | Disposition: A | Discharge status: Home / Self Care | Payer: Worker's Compensation | Attending: Emergency Medicine | Admitting: Emergency Medicine

## 2017-05-19 ENCOUNTER — Encounter (HOSPITAL_COMMUNITY): Payer: Self-pay

## 2017-05-19 DIAGNOSIS — Y939 Activity, unspecified: Secondary | ICD-10-CM | POA: Insufficient documentation

## 2017-05-19 DIAGNOSIS — S0101XA Laceration without foreign body of scalp, initial encounter: Secondary | ICD-10-CM | POA: Insufficient documentation

## 2017-05-19 DIAGNOSIS — Y929 Unspecified place or not applicable: Secondary | ICD-10-CM | POA: Diagnosis not present

## 2017-05-19 DIAGNOSIS — W2209XA Striking against other stationary object, initial encounter: Secondary | ICD-10-CM | POA: Insufficient documentation

## 2017-05-19 DIAGNOSIS — Y999 Unspecified external cause status: Secondary | ICD-10-CM | POA: Diagnosis not present

## 2017-05-19 DIAGNOSIS — Z79899 Other long term (current) drug therapy: Secondary | ICD-10-CM | POA: Diagnosis not present

## 2017-05-19 NOTE — ED Triage Notes (Signed)
Pt states that he was working and hit his head on a pole, appx  3 cm laceration to R front of head, denies LOC or dizziness. States last tetanus in march. Bleeding controlled.

## 2017-05-20 DIAGNOSIS — S0101XD Laceration without foreign body of scalp, subsequent encounter: Secondary | ICD-10-CM | POA: Diagnosis not present

## 2017-05-20 DIAGNOSIS — M179 Osteoarthritis of knee, unspecified: Secondary | ICD-10-CM | POA: Diagnosis not present

## 2017-05-20 DIAGNOSIS — Z23 Encounter for immunization: Secondary | ICD-10-CM | POA: Diagnosis not present

## 2017-05-20 NOTE — Discharge Instructions (Signed)
It was my pleasure taking care of you today!   Monitor area for signs of infection to include, but not limited to: increasing pain, spreading redness, drainage/pus, worsening swelling, or fevers. Return to emergency department for emergent changing or worsening symptoms.  See attached handout for home care instructions.
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
## 2017-05-20 NOTE — ED Notes (Signed)
Pt verbalizes understanding of d/c instructions. Pt ambulatory at d/c with all belongings.   

## 2017-07-01 ENCOUNTER — Other Ambulatory Visit: Payer: Self-pay

## 2017-07-01 ENCOUNTER — Encounter (HOSPITAL_COMMUNITY): Payer: Self-pay

## 2017-07-01 ENCOUNTER — Emergency Department (HOSPITAL_COMMUNITY)
Admission: EM | Admit: 2017-07-01 | Discharge: 2017-07-01 | Disposition: A | Discharge status: Home / Self Care | Payer: Medicare Other | Attending: Emergency Medicine | Admitting: Emergency Medicine

## 2017-07-01 ENCOUNTER — Emergency Department (HOSPITAL_COMMUNITY): Payer: Medicare Other

## 2017-07-01 DIAGNOSIS — I951 Orthostatic hypotension: Secondary | ICD-10-CM | POA: Insufficient documentation

## 2017-07-01 DIAGNOSIS — R42 Dizziness and giddiness: Secondary | ICD-10-CM

## 2017-07-01 DIAGNOSIS — R51 Headache: Secondary | ICD-10-CM | POA: Diagnosis not present

## 2017-07-01 DIAGNOSIS — Z79899 Other long term (current) drug therapy: Secondary | ICD-10-CM | POA: Insufficient documentation

## 2017-07-01 LAB — CBC
HCT: 47.1 % (ref 39.0–52.0)
Hemoglobin: 16.2 g/dL (ref 13.0–17.0)
MCH: 30.8 pg (ref 26.0–34.0)
MCHC: 34.4 g/dL (ref 30.0–36.0)
MCV: 89.5 fL (ref 78.0–100.0)
Platelets: 174 10*3/uL (ref 150–400)
RBC: 5.26 MIL/uL (ref 4.22–5.81)
RDW: 12.7 % (ref 11.5–15.5)
WBC: 9.1 10*3/uL (ref 4.0–10.5)

## 2017-07-01 LAB — BASIC METABOLIC PANEL
Anion gap: 8 (ref 5–15)
BUN: 12 mg/dL (ref 6–20)
CO2: 20 mmol/L — ABNORMAL LOW (ref 22–32)
Calcium: 8.7 mg/dL — ABNORMAL LOW (ref 8.9–10.3)
Chloride: 107 mmol/L (ref 101–111)
Creatinine, Ser: 0.97 mg/dL (ref 0.61–1.24)
GFR calc Af Amer: >60 mL/min (ref >60)
GFR calc non Af Amer: >60 mL/min (ref >60)
Glucose, Bld: 113 mg/dL — ABNORMAL HIGH (ref 65–99)
Potassium: 3.7 mmol/L (ref 3.5–5.1)
Sodium: 135 mmol/L (ref 135–145)

## 2017-07-01 LAB — URINALYSIS, ROUTINE W REFLEX MICROSCOPIC
Bilirubin Urine: NEGATIVE
Glucose, UA: NEGATIVE mg/dL
Hgb urine dipstick: NEGATIVE
Ketones, ur: NEGATIVE mg/dL
Leukocytes, UA: NEGATIVE
Nitrite: NEGATIVE
Protein, ur: NEGATIVE mg/dL
Specific Gravity, Urine: 1.008 (ref 1.005–1.030)
pH: 5 (ref 5.0–8.0)

## 2017-07-01 NOTE — Discharge Instructions (Addendum)
Thank you for allowing [place] to care for you  Your symptoms are believed to have been caused by low blood pressure when you stand. - Please stay hydrated to help prevent low blood pressures  Please follow up with your primary care provider and ask about a referral for an echocardiogram.   Return to the ED if you experience severe symptoms.

## 2017-07-01 NOTE — ED Triage Notes (Signed)
Pt. Reports having a headache since he had a head injury 3 months ago.  He hit the rt. Top of his head while working and required sutures.  Pt. Reports having intermittent dizziness since then. In the last 48 hrs he feels like he is going to pass out at times. He needs to grab onto something.   Skin is p/w/d .  Pt. Is alert and oriented X4.  Pt. Does report having nausea and diarrhea.   NIH negative , VAN negative.  Pt. Reports having decreased hearing in both ears with rt. Ear pain at times
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
## 2017-07-09 DIAGNOSIS — R42 Dizziness and giddiness: Secondary | ICD-10-CM | POA: Diagnosis not present

## 2017-07-29 DIAGNOSIS — W009XXA Unspecified fall due to ice and snow, initial encounter: Secondary | ICD-10-CM | POA: Diagnosis not present

## 2017-07-29 DIAGNOSIS — M25512 Pain in left shoulder: Secondary | ICD-10-CM | POA: Diagnosis not present

## 2017-08-06 DIAGNOSIS — M25519 Pain in unspecified shoulder: Secondary | ICD-10-CM | POA: Diagnosis not present

## 2017-08-20 DIAGNOSIS — M67912 Unspecified disorder of synovium and tendon, left shoulder: Secondary | ICD-10-CM | POA: Diagnosis not present

## 2017-08-27 DIAGNOSIS — M25512 Pain in left shoulder: Secondary | ICD-10-CM | POA: Diagnosis not present

## 2017-09-02 DIAGNOSIS — H903 Sensorineural hearing loss, bilateral: Secondary | ICD-10-CM | POA: Diagnosis not present

## 2017-09-02 DIAGNOSIS — H9113 Presbycusis, bilateral: Secondary | ICD-10-CM | POA: Diagnosis not present

## 2017-09-02 DIAGNOSIS — I951 Orthostatic hypotension: Secondary | ICD-10-CM | POA: Diagnosis not present

## 2017-09-03 DIAGNOSIS — M75122 Complete rotator cuff tear or rupture of left shoulder, not specified as traumatic: Secondary | ICD-10-CM | POA: Diagnosis not present

## 2017-09-15 DIAGNOSIS — M75122 Complete rotator cuff tear or rupture of left shoulder, not specified as traumatic: Secondary | ICD-10-CM | POA: Diagnosis not present

## 2017-10-28 DIAGNOSIS — S46192A Other injury of muscle, fascia and tendon of long head of biceps, left arm, initial encounter: Secondary | ICD-10-CM | POA: Diagnosis not present

## 2017-10-28 DIAGNOSIS — M7542 Impingement syndrome of left shoulder: Secondary | ICD-10-CM | POA: Diagnosis not present

## 2017-10-28 DIAGNOSIS — S46012A Strain of muscle(s) and tendon(s) of the rotator cuff of left shoulder, initial encounter: Secondary | ICD-10-CM | POA: Diagnosis not present

## 2017-10-28 DIAGNOSIS — G8918 Other acute postprocedural pain: Secondary | ICD-10-CM | POA: Diagnosis not present

## 2017-11-12 DIAGNOSIS — Z9889 Other specified postprocedural states: Secondary | ICD-10-CM | POA: Diagnosis not present

## 2017-11-13 DIAGNOSIS — M75122 Complete rotator cuff tear or rupture of left shoulder, not specified as traumatic: Secondary | ICD-10-CM | POA: Diagnosis not present

## 2017-11-13 DIAGNOSIS — M25512 Pain in left shoulder: Secondary | ICD-10-CM | POA: Diagnosis not present

## 2017-11-20 DIAGNOSIS — M75122 Complete rotator cuff tear or rupture of left shoulder, not specified as traumatic: Secondary | ICD-10-CM | POA: Diagnosis not present

## 2017-11-20 DIAGNOSIS — M25512 Pain in left shoulder: Secondary | ICD-10-CM | POA: Diagnosis not present

## 2017-11-24 DIAGNOSIS — M25512 Pain in left shoulder: Secondary | ICD-10-CM | POA: Diagnosis not present

## 2017-11-24 DIAGNOSIS — M75122 Complete rotator cuff tear or rupture of left shoulder, not specified as traumatic: Secondary | ICD-10-CM | POA: Diagnosis not present

## 2017-11-24 IMAGING — CT CT HEAD W/O CM
4 series · 16 of 47 positions shown, 18 images · non-contrast
Comparison: None.

CLINICAL DATA: 70-year-old male with headache since head injury 3
months ago when hit top of head. Intermittent dizziness. Initial
encounter.

EXAM:
CT HEAD WITHOUT CONTRAST
TECHNIQUE: Contiguous axial images were obtained from the base of the skull
through the vertex without intravenous contrast.

[Series 3: head without · axial · non-contrast · 0.42mm/px · z∈[-44,+76]mm · 7 of 32 slices shown, 9 images]
[im 4/32  brain]
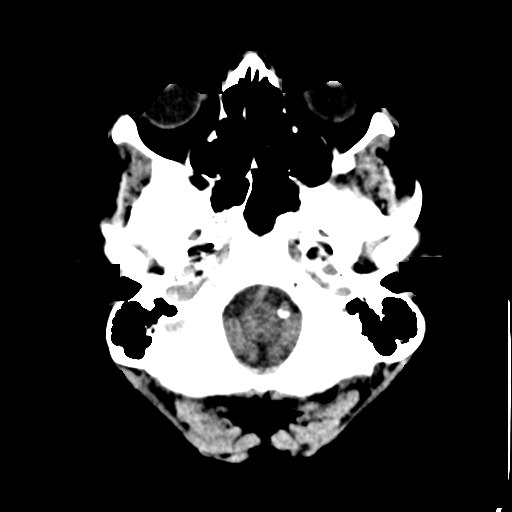
[im 4/32  bone]
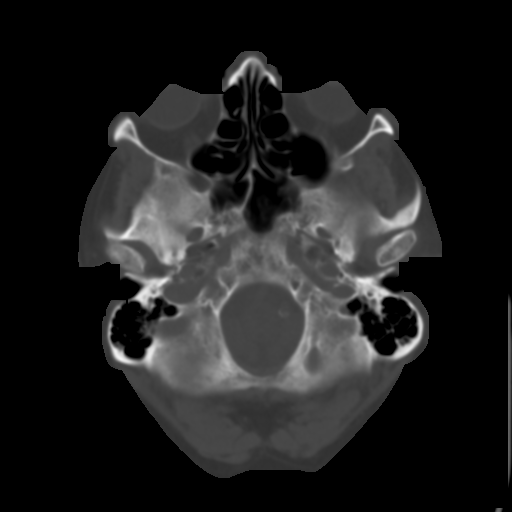
[im 8/32  brain]
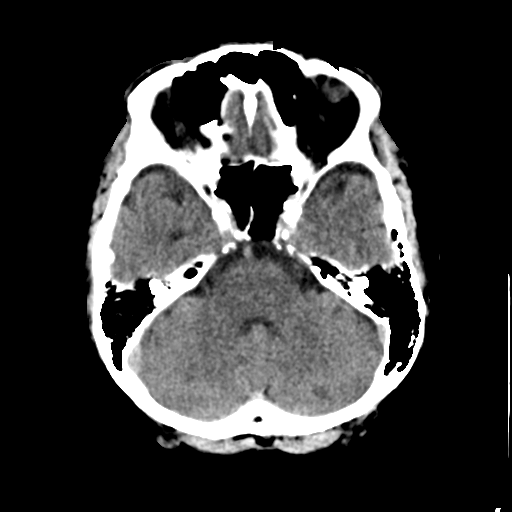
[im 12/32  brain]
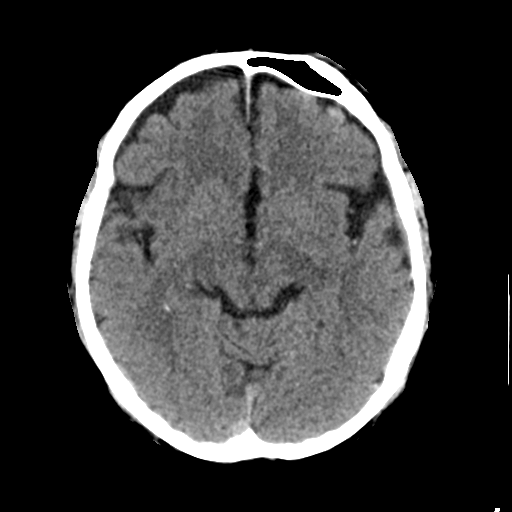
[im 16/32  brain]
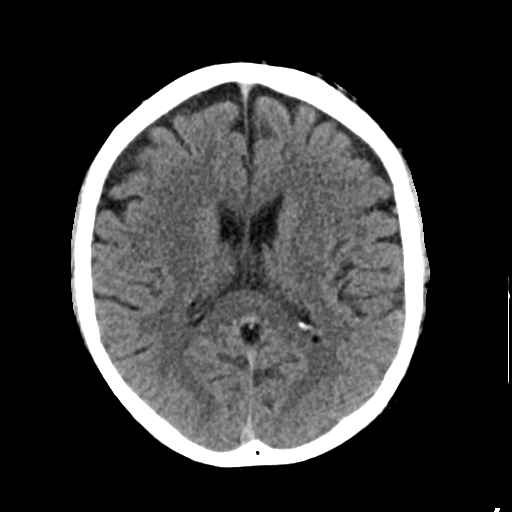
[im 20/32  brain]
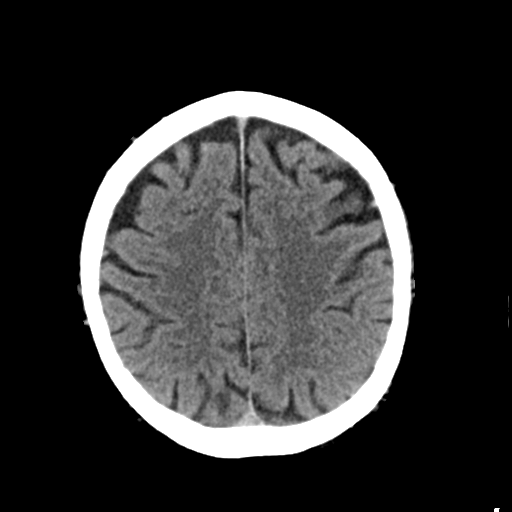
[im 20/32  bone]
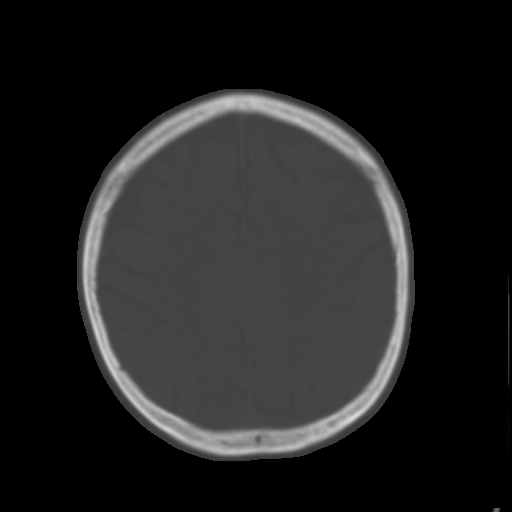
[im 24/32  brain]
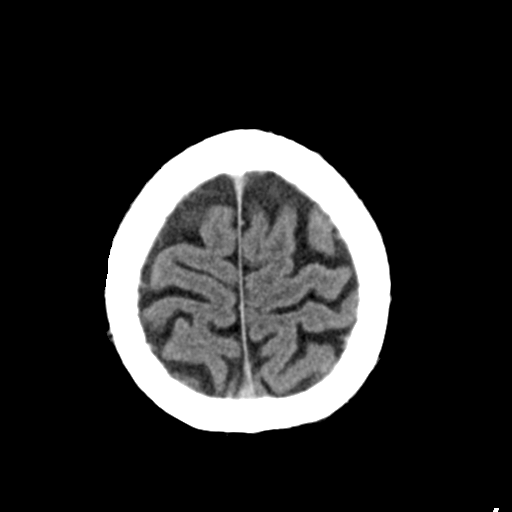
[im 28/32  brain]
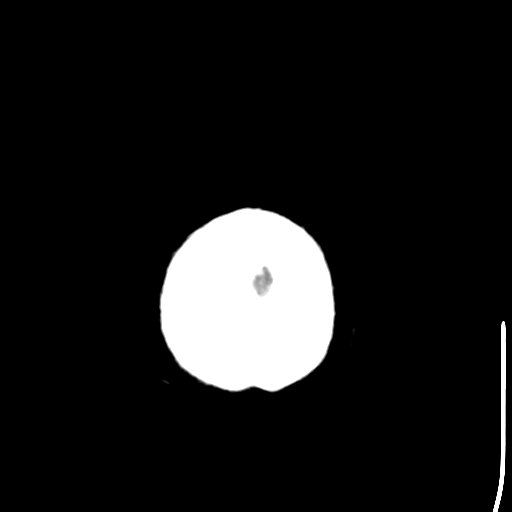

[Series 4: head bone · axial · 0.42mm/px · z∈[-45,-13]mm · 3 of 78 slices shown]
[im 8/78  bone]
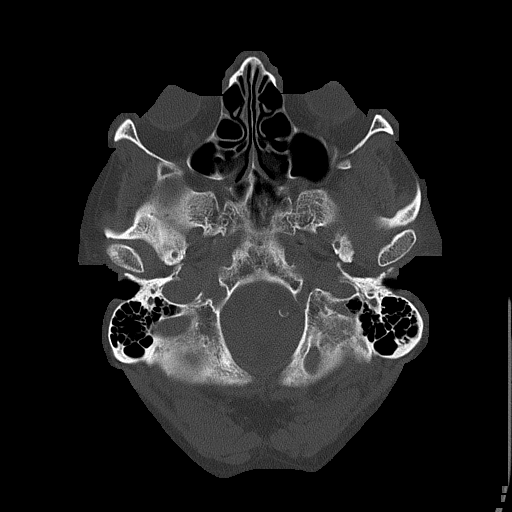
[im 16/78  bone]
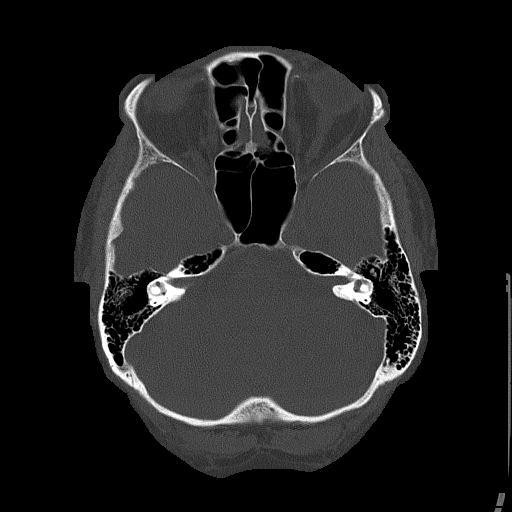
[im 24/78  bone]
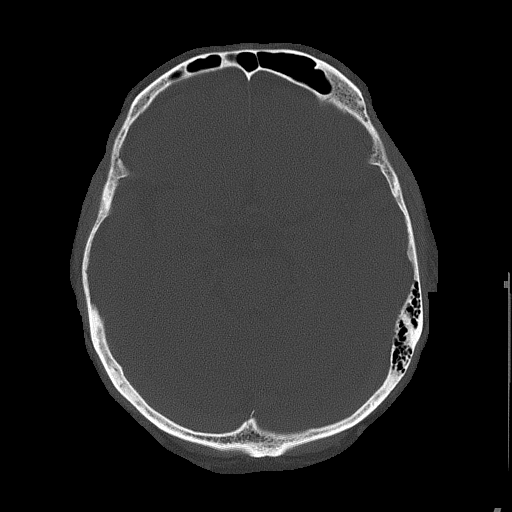

[Series 5: head without cor · coronal · non-contrast · 0.32mm/px · 3 of 70 slices shown]
[im 24/70  brain]
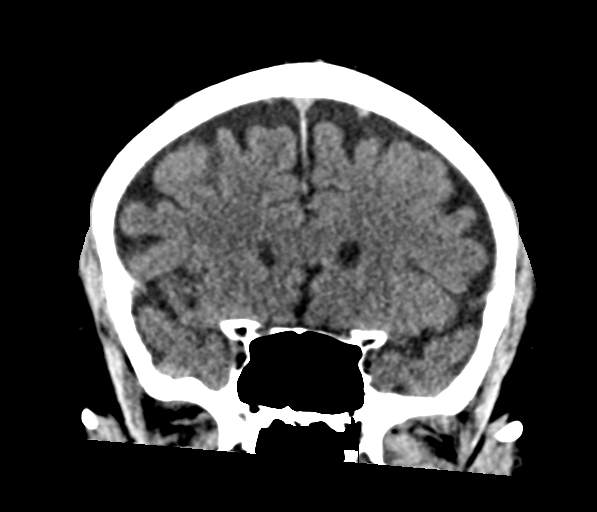
[im 31/70  brain]
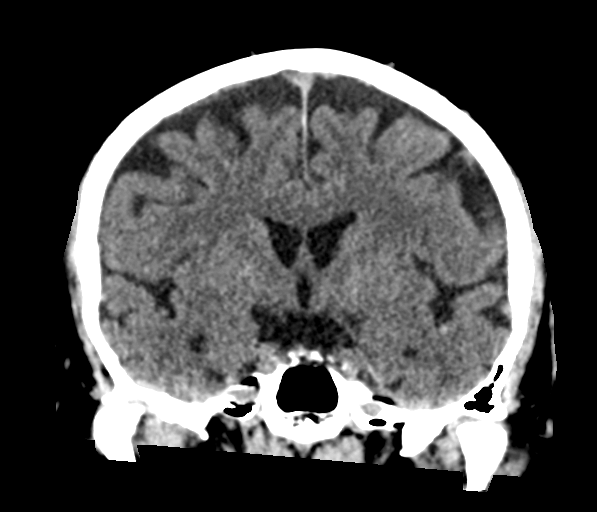
[im 39/70  brain]
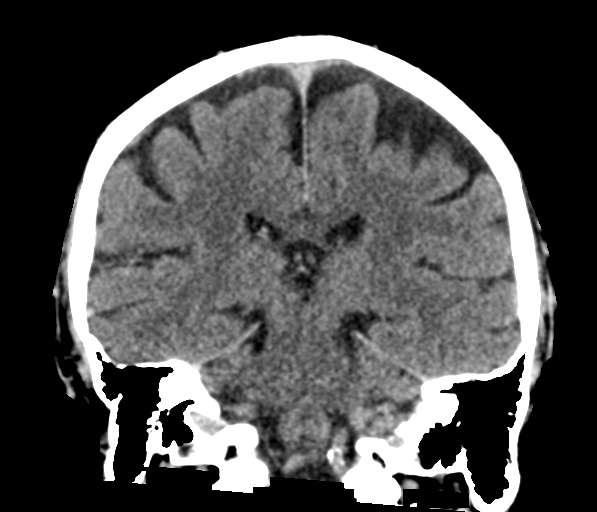

[Series 6: head without sag · sagittal · non-contrast · 0.32mm/px · 3 of 63 slices shown]
[im 21/63  brain]
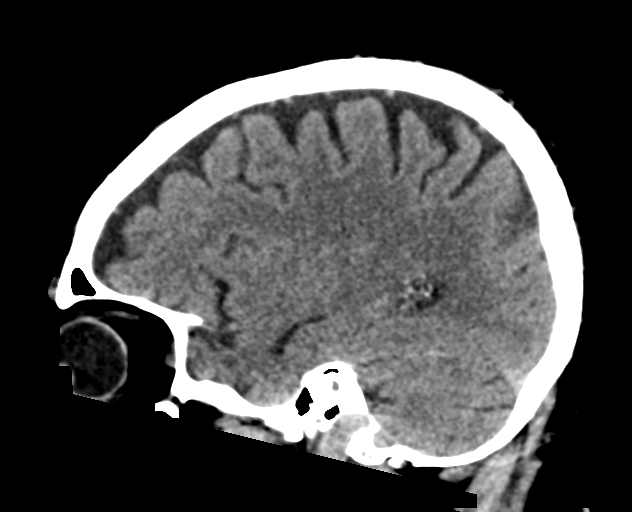
[im 32/63  brain]
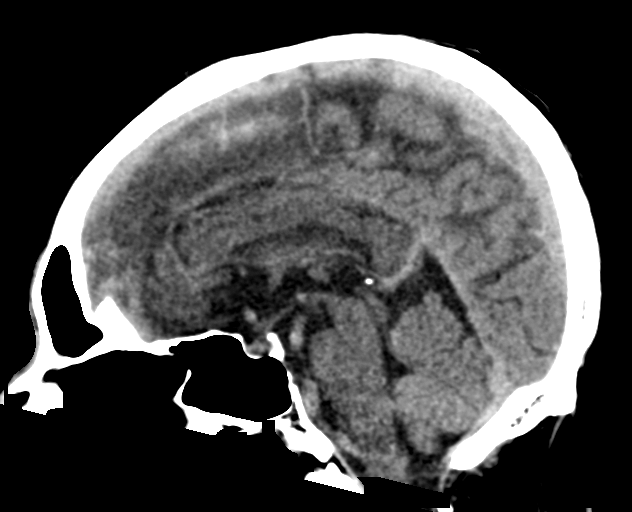
[im 42/63  brain]
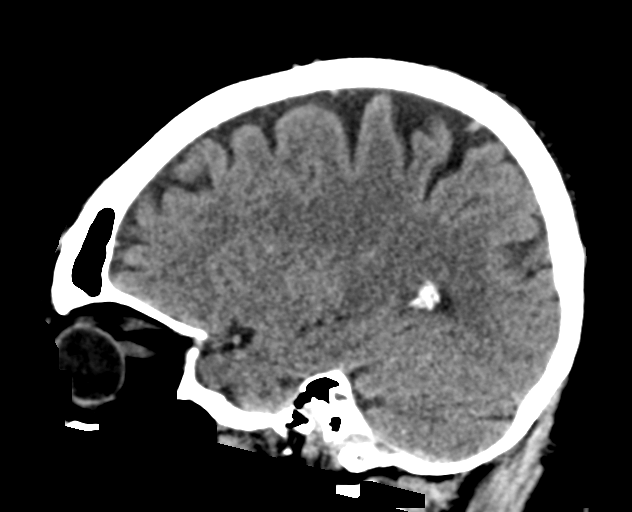

[16 of 47 positions shown; findings below may reference images not displayed]

FINDINGS: Brain: No intracranial hemorrhage or CT evidence of large acute
infarct.

Mild global atrophy.

No intracranial mass lesion noted on this unenhanced exam.

Vascular: Vascular calcifications

Skull: No skull fracture

Sinuses/Orbits: No acute orbital abnormality. Visualized paranasal
sinuses are clear.

Other: Left forehead subcutaneous abnormality may represent result
of laceration.

Mastoid air cells and middle ear cavities are clear.

Incidentally note congenital nonunion posterior C1 ring.
IMPRESSION: No skull fracture or intracranial hemorrhage.

No CT evidence of large acute infarct.

Global atrophy

## 2017-11-26 DIAGNOSIS — M25512 Pain in left shoulder: Secondary | ICD-10-CM | POA: Diagnosis not present

## 2017-11-26 DIAGNOSIS — M75122 Complete rotator cuff tear or rupture of left shoulder, not specified as traumatic: Secondary | ICD-10-CM | POA: Diagnosis not present

## 2017-12-01 DIAGNOSIS — M25512 Pain in left shoulder: Secondary | ICD-10-CM | POA: Diagnosis not present

## 2017-12-01 DIAGNOSIS — M75122 Complete rotator cuff tear or rupture of left shoulder, not specified as traumatic: Secondary | ICD-10-CM | POA: Diagnosis not present

## 2017-12-04 DIAGNOSIS — M25512 Pain in left shoulder: Secondary | ICD-10-CM | POA: Diagnosis not present

## 2017-12-04 DIAGNOSIS — M75122 Complete rotator cuff tear or rupture of left shoulder, not specified as traumatic: Secondary | ICD-10-CM | POA: Diagnosis not present

## 2017-12-08 DIAGNOSIS — M75122 Complete rotator cuff tear or rupture of left shoulder, not specified as traumatic: Secondary | ICD-10-CM | POA: Diagnosis not present

## 2017-12-08 DIAGNOSIS — M25512 Pain in left shoulder: Secondary | ICD-10-CM | POA: Diagnosis not present

## 2017-12-11 DIAGNOSIS — M75122 Complete rotator cuff tear or rupture of left shoulder, not specified as traumatic: Secondary | ICD-10-CM | POA: Diagnosis not present

## 2017-12-11 DIAGNOSIS — M25512 Pain in left shoulder: Secondary | ICD-10-CM | POA: Diagnosis not present

## 2017-12-17 DIAGNOSIS — M25512 Pain in left shoulder: Secondary | ICD-10-CM | POA: Diagnosis not present

## 2017-12-17 DIAGNOSIS — Z9889 Other specified postprocedural states: Secondary | ICD-10-CM | POA: Diagnosis not present

## 2017-12-17 DIAGNOSIS — M75122 Complete rotator cuff tear or rupture of left shoulder, not specified as traumatic: Secondary | ICD-10-CM | POA: Diagnosis not present

## 2017-12-19 DIAGNOSIS — M25512 Pain in left shoulder: Secondary | ICD-10-CM | POA: Diagnosis not present

## 2017-12-19 DIAGNOSIS — M75122 Complete rotator cuff tear or rupture of left shoulder, not specified as traumatic: Secondary | ICD-10-CM | POA: Diagnosis not present

## 2017-12-23 DIAGNOSIS — M25512 Pain in left shoulder: Secondary | ICD-10-CM | POA: Diagnosis not present

## 2017-12-23 DIAGNOSIS — M75122 Complete rotator cuff tear or rupture of left shoulder, not specified as traumatic: Secondary | ICD-10-CM | POA: Diagnosis not present

## 2017-12-25 DIAGNOSIS — M75122 Complete rotator cuff tear or rupture of left shoulder, not specified as traumatic: Secondary | ICD-10-CM | POA: Diagnosis not present

## 2017-12-25 DIAGNOSIS — M25512 Pain in left shoulder: Secondary | ICD-10-CM | POA: Diagnosis not present

## 2017-12-30 DIAGNOSIS — M75122 Complete rotator cuff tear or rupture of left shoulder, not specified as traumatic: Secondary | ICD-10-CM | POA: Diagnosis not present

## 2017-12-30 DIAGNOSIS — M25512 Pain in left shoulder: Secondary | ICD-10-CM | POA: Diagnosis not present

## 2018-01-01 DIAGNOSIS — M25512 Pain in left shoulder: Secondary | ICD-10-CM | POA: Diagnosis not present

## 2018-01-01 DIAGNOSIS — M75122 Complete rotator cuff tear or rupture of left shoulder, not specified as traumatic: Secondary | ICD-10-CM | POA: Diagnosis not present

## 2018-01-06 DIAGNOSIS — M25512 Pain in left shoulder: Secondary | ICD-10-CM | POA: Diagnosis not present

## 2018-01-06 DIAGNOSIS — M75122 Complete rotator cuff tear or rupture of left shoulder, not specified as traumatic: Secondary | ICD-10-CM | POA: Diagnosis not present

## 2018-03-06 DIAGNOSIS — M25512 Pain in left shoulder: Secondary | ICD-10-CM | POA: Diagnosis not present

## 2018-04-01 DIAGNOSIS — M25572 Pain in left ankle and joints of left foot: Secondary | ICD-10-CM | POA: Diagnosis not present

## 2018-04-22 DIAGNOSIS — M21612 Bunion of left foot: Secondary | ICD-10-CM | POA: Diagnosis not present

## 2018-04-22 DIAGNOSIS — Z79899 Other long term (current) drug therapy: Secondary | ICD-10-CM | POA: Diagnosis not present

## 2018-04-22 DIAGNOSIS — M25572 Pain in left ankle and joints of left foot: Secondary | ICD-10-CM | POA: Diagnosis not present

## 2018-04-22 DIAGNOSIS — Z01812 Encounter for preprocedural laboratory examination: Secondary | ICD-10-CM | POA: Diagnosis not present

## 2018-05-12 DIAGNOSIS — M2012 Hallux valgus (acquired), left foot: Secondary | ICD-10-CM | POA: Diagnosis not present

## 2018-05-12 DIAGNOSIS — M21612 Bunion of left foot: Secondary | ICD-10-CM | POA: Diagnosis not present

## 2018-05-12 HISTORY — PX: FOOT SURGERY: SHX648

## 2018-05-18 DIAGNOSIS — M12272 Villonodular synovitis (pigmented), left ankle and foot: Secondary | ICD-10-CM | POA: Diagnosis not present

## 2018-05-25 DIAGNOSIS — M12272 Villonodular synovitis (pigmented), left ankle and foot: Secondary | ICD-10-CM | POA: Diagnosis not present

## 2018-06-03 DIAGNOSIS — Z23 Encounter for immunization: Secondary | ICD-10-CM | POA: Diagnosis not present

## 2018-06-03 DIAGNOSIS — E7800 Pure hypercholesterolemia, unspecified: Secondary | ICD-10-CM | POA: Diagnosis not present

## 2018-06-03 DIAGNOSIS — Z79899 Other long term (current) drug therapy: Secondary | ICD-10-CM | POA: Diagnosis not present

## 2018-06-03 DIAGNOSIS — Z125 Encounter for screening for malignant neoplasm of prostate: Secondary | ICD-10-CM | POA: Diagnosis not present

## 2018-06-03 DIAGNOSIS — K219 Gastro-esophageal reflux disease without esophagitis: Secondary | ICD-10-CM | POA: Diagnosis not present

## 2018-06-03 DIAGNOSIS — R0602 Shortness of breath: Secondary | ICD-10-CM | POA: Diagnosis not present

## 2018-06-11 DIAGNOSIS — M12272 Villonodular synovitis (pigmented), left ankle and foot: Secondary | ICD-10-CM | POA: Diagnosis not present

## 2018-07-09 DIAGNOSIS — M12272 Villonodular synovitis (pigmented), left ankle and foot: Secondary | ICD-10-CM | POA: Diagnosis not present

## 2018-07-09 DIAGNOSIS — M79674 Pain in right toe(s): Secondary | ICD-10-CM | POA: Diagnosis not present

## 2018-07-09 DIAGNOSIS — M79675 Pain in left toe(s): Secondary | ICD-10-CM | POA: Diagnosis not present

## 2018-07-09 DIAGNOSIS — B351 Tinea unguium: Secondary | ICD-10-CM | POA: Diagnosis not present

## 2018-07-09 DIAGNOSIS — L600 Ingrowing nail: Secondary | ICD-10-CM | POA: Diagnosis not present

## 2018-08-07 DIAGNOSIS — B351 Tinea unguium: Secondary | ICD-10-CM | POA: Diagnosis not present

## 2018-08-07 DIAGNOSIS — L600 Ingrowing nail: Secondary | ICD-10-CM | POA: Diagnosis not present

## 2018-08-07 DIAGNOSIS — M79674 Pain in right toe(s): Secondary | ICD-10-CM | POA: Diagnosis not present

## 2018-08-07 DIAGNOSIS — M79675 Pain in left toe(s): Secondary | ICD-10-CM | POA: Diagnosis not present
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
## 2018-08-31 ENCOUNTER — Encounter (INDEPENDENT_AMBULATORY_CARE_PROVIDER_SITE_OTHER): Payer: Self-pay

## 2018-08-31 ENCOUNTER — Ambulatory Visit (INDEPENDENT_AMBULATORY_CARE_PROVIDER_SITE_OTHER): Payer: Medicare Other | Admitting: Cardiovascular Disease

## 2018-08-31 DIAGNOSIS — R0602 Shortness of breath: Secondary | ICD-10-CM | POA: Diagnosis not present

## 2018-08-31 DIAGNOSIS — R079 Chest pain, unspecified: Secondary | ICD-10-CM | POA: Diagnosis not present

## 2018-08-31 NOTE — Patient Instructions (Addendum)
Medication Instructions:   If you need a refill on your cardiac medications before your next appointment, please call your pharmacy.   Lab work:  If you have labs (blood work) drawn today and your tests are completely normal, you will receive your results only by: Marland Kitchen MyChart Message (if you have MyChart) OR . A paper copy in the mail If you have any lab test that is abnormal or we need to change your treatment, we will call you to review the results.  Testing/Procedures: Your physician has requested that you have an echocardiogram. Echocardiography is a painless test that uses sound waves to create images of your heart. It provides your doctor with information about the size and shape of your heart and how well your heart's chambers and valves are working. This procedure takes approximately one hour. There are no restrictions for this procedure.  Your physician has requested that you have en exercise stress myoview. For further information please visit [email or web address]. Please follow instruction sheet, as given.  Follow-Up: At [facility], you and your health needs are our priority.  As part of our continuing mission to provide you with exceptional heart care, we have created designated Provider Care Teams.  These Care Teams include your primary Cardiologist (physician) and Advanced Practice Providers (APPs -  Physician Assistants and Nurse Practitioners) who all work together to provide you with the care you need, when you need it. Your physician recommends that you schedule a follow-up appointment as needed with Dr. Johnsie Cancel.

## 2018-09-01 ENCOUNTER — Telehealth (HOSPITAL_COMMUNITY): Payer: Self-pay | Admitting: *Deleted

## 2018-09-01 NOTE — Telephone Encounter (Signed)
Patient's daughter (per DPR) given detailed instructions per Myocardial Perfusion Study Information Sheet for the test on 09/04/18 at 7:15. Patient notified to arrive 15 minutes early and that it is imperative to arrive on time for appointment to keep from having the test rescheduled.  If you need to cancel or reschedule your appointment, please call the office within 24 hours of your appointment. . Patient verbalized understanding.Jack [name]

## 2018-09-04 ENCOUNTER — Ambulatory Visit (HOSPITAL_COMMUNITY): Payer: Medicare Other | Attending: Cardiovascular Disease

## 2018-09-04 ENCOUNTER — Other Ambulatory Visit: Payer: Self-pay

## 2018-09-04 ENCOUNTER — Ambulatory Visit (HOSPITAL_BASED_OUTPATIENT_CLINIC_OR_DEPARTMENT_OTHER): Payer: Medicare Other

## 2018-09-04 DIAGNOSIS — M79675 Pain in left toe(s): Secondary | ICD-10-CM | POA: Diagnosis not present

## 2018-09-04 DIAGNOSIS — R0602 Shortness of breath: Secondary | ICD-10-CM

## 2018-09-04 DIAGNOSIS — R079 Chest pain, unspecified: Secondary | ICD-10-CM | POA: Diagnosis not present

## 2018-09-04 DIAGNOSIS — L600 Ingrowing nail: Secondary | ICD-10-CM | POA: Diagnosis not present

## 2018-09-04 DIAGNOSIS — M79674 Pain in right toe(s): Secondary | ICD-10-CM | POA: Diagnosis not present

## 2018-09-04 LAB — MYOCARDIAL PERFUSION IMAGING
Estimated workload: 6.4 METS
Exercise duration (min): 4 min
Exercise duration (sec): 30 s
LV dias vol: 66 mL (ref 62–150)
LV sys vol: 28 mL
MPHR: 149 {beats}/min
Peak HR: 134 {beats}/min
Percent HR: 89 %
Rest HR: 69 {beats}/min
SDS: 0
SRS: 2
SSS: 2
TID: 0.86

## 2018-09-04 LAB — ECHOCARDIOGRAM COMPLETE
Height: 63 in
Weight: 3264 oz

## 2018-09-04 MED ORDER — TECHNETIUM TC 99M TETROFOSMIN IV KIT
10.3000 | PACK | Freq: Once | INTRAVENOUS | Status: AC | PRN
  Administered 2018-09-04: 10.3 via INTRAVENOUS
  Filled 2018-09-04: qty 11

## 2018-09-04 MED ORDER — TECHNETIUM TC 99M TETROFOSMIN IV KIT
32.7000 | PACK | Freq: Once | INTRAVENOUS | Status: AC | PRN
  Administered 2018-09-04: 32.7 via INTRAVENOUS
  Filled 2018-09-04: qty 33

## 2019-04-12 DIAGNOSIS — M12271 Villonodular synovitis (pigmented), right ankle and foot: Secondary | ICD-10-CM | POA: Diagnosis not present

## 2019-04-12 DIAGNOSIS — M2011 Hallux valgus (acquired), right foot: Secondary | ICD-10-CM | POA: Diagnosis not present

## 2019-04-14 DIAGNOSIS — Z8619 Personal history of other infectious and parasitic diseases: Secondary | ICD-10-CM | POA: Diagnosis not present

## 2019-04-14 DIAGNOSIS — Z8719 Personal history of other diseases of the digestive system: Secondary | ICD-10-CM | POA: Diagnosis not present

## 2019-04-14 DIAGNOSIS — R140 Abdominal distension (gaseous): Secondary | ICD-10-CM | POA: Diagnosis not present

## 2019-04-14 DIAGNOSIS — R194 Change in bowel habit: Secondary | ICD-10-CM | POA: Diagnosis not present

## 2019-04-21 DIAGNOSIS — M2011 Hallux valgus (acquired), right foot: Secondary | ICD-10-CM | POA: Diagnosis not present

## 2019-04-21 DIAGNOSIS — M21611 Bunion of right foot: Secondary | ICD-10-CM | POA: Diagnosis not present

## 2019-04-21 DIAGNOSIS — Z01812 Encounter for preprocedural laboratory examination: Secondary | ICD-10-CM | POA: Diagnosis not present

## 2019-04-27 DIAGNOSIS — Z8619 Personal history of other infectious and parasitic diseases: Secondary | ICD-10-CM | POA: Diagnosis not present

## 2019-04-27 DIAGNOSIS — Z8719 Personal history of other diseases of the digestive system: Secondary | ICD-10-CM | POA: Diagnosis not present

## 2019-04-27 DIAGNOSIS — R140 Abdominal distension (gaseous): Secondary | ICD-10-CM | POA: Diagnosis not present

## 2019-04-28 DIAGNOSIS — R194 Change in bowel habit: Secondary | ICD-10-CM | POA: Diagnosis not present

## 2019-04-28 DIAGNOSIS — K633 Ulcer of intestine: Secondary | ICD-10-CM | POA: Diagnosis not present

## 2019-04-28 DIAGNOSIS — D123 Benign neoplasm of transverse colon: Secondary | ICD-10-CM | POA: Diagnosis not present

## 2019-04-28 DIAGNOSIS — K5289 Other specified noninfective gastroenteritis and colitis: Secondary | ICD-10-CM | POA: Diagnosis not present

## 2019-05-04 DIAGNOSIS — D123 Benign neoplasm of transverse colon: Secondary | ICD-10-CM | POA: Diagnosis not present

## 2019-05-04 DIAGNOSIS — K5289 Other specified noninfective gastroenteritis and colitis: Secondary | ICD-10-CM | POA: Diagnosis not present

## 2019-05-18 DIAGNOSIS — M2011 Hallux valgus (acquired), right foot: Secondary | ICD-10-CM | POA: Diagnosis not present

## 2019-05-26 DIAGNOSIS — M12271 Villonodular synovitis (pigmented), right ankle and foot: Secondary | ICD-10-CM | POA: Diagnosis not present

## 2019-06-02 DIAGNOSIS — M12271 Villonodular synovitis (pigmented), right ankle and foot: Secondary | ICD-10-CM | POA: Diagnosis not present

## 2019-06-22 DIAGNOSIS — M12271 Villonodular synovitis (pigmented), right ankle and foot: Secondary | ICD-10-CM | POA: Diagnosis not present

## 2019-06-30 DIAGNOSIS — Z79899 Other long term (current) drug therapy: Secondary | ICD-10-CM | POA: Diagnosis not present

## 2019-06-30 DIAGNOSIS — Z0000 Encounter for general adult medical examination without abnormal findings: Secondary | ICD-10-CM | POA: Diagnosis not present

## 2019-06-30 DIAGNOSIS — Z1159 Encounter for screening for other viral diseases: Secondary | ICD-10-CM | POA: Diagnosis not present

## 2019-06-30 DIAGNOSIS — Z125 Encounter for screening for malignant neoplasm of prostate: Secondary | ICD-10-CM | POA: Diagnosis not present

## 2019-06-30 DIAGNOSIS — G629 Polyneuropathy, unspecified: Secondary | ICD-10-CM | POA: Diagnosis not present

## 2019-06-30 DIAGNOSIS — R413 Other amnesia: Secondary | ICD-10-CM | POA: Diagnosis not present

## 2019-06-30 DIAGNOSIS — E7800 Pure hypercholesterolemia, unspecified: Secondary | ICD-10-CM | POA: Diagnosis not present

## 2019-06-30 DIAGNOSIS — K219 Gastro-esophageal reflux disease without esophagitis: Secondary | ICD-10-CM | POA: Diagnosis not present

## 2019-06-30 DIAGNOSIS — D485 Neoplasm of uncertain behavior of skin: Secondary | ICD-10-CM | POA: Diagnosis not present

## 2019-07-05 DIAGNOSIS — H10503 Unspecified blepharoconjunctivitis, bilateral: Secondary | ICD-10-CM | POA: Diagnosis not present

## 2019-07-09 DIAGNOSIS — Z125 Encounter for screening for malignant neoplasm of prostate: Secondary | ICD-10-CM | POA: Diagnosis not present

## 2019-07-09 DIAGNOSIS — E7800 Pure hypercholesterolemia, unspecified: Secondary | ICD-10-CM | POA: Diagnosis not present

## 2019-07-09 DIAGNOSIS — R413 Other amnesia: Secondary | ICD-10-CM | POA: Diagnosis not present

## 2019-07-09 DIAGNOSIS — E782 Mixed hyperlipidemia: Secondary | ICD-10-CM | POA: Diagnosis not present

## 2019-07-09 DIAGNOSIS — Z1159 Encounter for screening for other viral diseases: Secondary | ICD-10-CM | POA: Diagnosis not present

## 2019-07-09 DIAGNOSIS — K219 Gastro-esophageal reflux disease without esophagitis: Secondary | ICD-10-CM | POA: Diagnosis not present

## 2019-07-09 DIAGNOSIS — D485 Neoplasm of uncertain behavior of skin: Secondary | ICD-10-CM | POA: Diagnosis not present

## 2019-07-09 DIAGNOSIS — G629 Polyneuropathy, unspecified: Secondary | ICD-10-CM | POA: Diagnosis not present

## 2019-07-09 DIAGNOSIS — Z0000 Encounter for general adult medical examination without abnormal findings: Secondary | ICD-10-CM | POA: Diagnosis not present

## 2019-07-09 DIAGNOSIS — Z79899 Other long term (current) drug therapy: Secondary | ICD-10-CM | POA: Diagnosis not present

## 2019-07-12 DIAGNOSIS — R262 Difficulty in walking, not elsewhere classified: Secondary | ICD-10-CM | POA: Diagnosis not present

## 2019-07-12 DIAGNOSIS — M79671 Pain in right foot: Secondary | ICD-10-CM | POA: Diagnosis not present

## 2019-07-20 DIAGNOSIS — M12271 Villonodular synovitis (pigmented), right ankle and foot: Secondary | ICD-10-CM | POA: Diagnosis not present

## 2019-09-10 DIAGNOSIS — K5190 Ulcerative colitis, unspecified, without complications: Secondary | ICD-10-CM | POA: Diagnosis not present

## 2019-09-23 DIAGNOSIS — L918 Other hypertrophic disorders of the skin: Secondary | ICD-10-CM | POA: Diagnosis not present

## 2019-09-23 DIAGNOSIS — D1801 Hemangioma of skin and subcutaneous tissue: Secondary | ICD-10-CM | POA: Diagnosis not present

## 2019-09-23 DIAGNOSIS — Z85828 Personal history of other malignant neoplasm of skin: Secondary | ICD-10-CM | POA: Diagnosis not present

## 2019-09-23 DIAGNOSIS — C44311 Basal cell carcinoma of skin of nose: Secondary | ICD-10-CM | POA: Diagnosis not present

## 2019-09-23 DIAGNOSIS — L821 Other seborrheic keratosis: Secondary | ICD-10-CM | POA: Diagnosis not present

## 2019-10-11 DIAGNOSIS — Z85828 Personal history of other malignant neoplasm of skin: Secondary | ICD-10-CM | POA: Diagnosis not present

## 2019-10-11 DIAGNOSIS — C44311 Basal cell carcinoma of skin of nose: Secondary | ICD-10-CM | POA: Diagnosis not present

## 2019-10-27 DIAGNOSIS — Z23 Encounter for immunization: Secondary | ICD-10-CM | POA: Diagnosis not present

## 2019-11-24 DIAGNOSIS — Z23 Encounter for immunization: Secondary | ICD-10-CM | POA: Diagnosis not present

## 2020-02-09 DIAGNOSIS — M179 Osteoarthritis of knee, unspecified: Secondary | ICD-10-CM | POA: Diagnosis not present

## 2020-02-09 DIAGNOSIS — E7800 Pure hypercholesterolemia, unspecified: Secondary | ICD-10-CM | POA: Diagnosis not present

## 2020-02-09 DIAGNOSIS — M19041 Primary osteoarthritis, right hand: Secondary | ICD-10-CM | POA: Diagnosis not present

## 2020-04-06 DIAGNOSIS — Z23 Encounter for immunization: Secondary | ICD-10-CM | POA: Diagnosis not present

## 2020-05-17 DIAGNOSIS — E7800 Pure hypercholesterolemia, unspecified: Secondary | ICD-10-CM | POA: Diagnosis not present

## 2020-05-17 DIAGNOSIS — M179 Osteoarthritis of knee, unspecified: Secondary | ICD-10-CM | POA: Diagnosis not present

## 2020-05-17 DIAGNOSIS — M19041 Primary osteoarthritis, right hand: Secondary | ICD-10-CM | POA: Diagnosis not present

## 2020-06-05 DIAGNOSIS — Z8719 Personal history of other diseases of the digestive system: Secondary | ICD-10-CM | POA: Diagnosis not present

## 2020-06-12 DIAGNOSIS — M545 Low back pain, unspecified: Secondary | ICD-10-CM | POA: Diagnosis not present

## 2020-06-21 DIAGNOSIS — M545 Low back pain, unspecified: Secondary | ICD-10-CM | POA: Diagnosis not present

## 2020-06-26 DIAGNOSIS — M545 Low back pain, unspecified: Secondary | ICD-10-CM | POA: Diagnosis not present

## 2020-06-26 DIAGNOSIS — M4696 Unspecified inflammatory spondylopathy, lumbar region: Secondary | ICD-10-CM | POA: Diagnosis not present

## 2020-07-31 DIAGNOSIS — M47816 Spondylosis without myelopathy or radiculopathy, lumbar region: Secondary | ICD-10-CM | POA: Diagnosis not present

## 2020-08-02 DIAGNOSIS — Z1159 Encounter for screening for other viral diseases: Secondary | ICD-10-CM | POA: Diagnosis not present

## 2020-08-07 DIAGNOSIS — K219 Gastro-esophageal reflux disease without esophagitis: Secondary | ICD-10-CM | POA: Diagnosis not present

## 2020-08-07 DIAGNOSIS — B9681 Helicobacter pylori [H. pylori] as the cause of diseases classified elsewhere: Secondary | ICD-10-CM | POA: Diagnosis not present

## 2020-08-07 DIAGNOSIS — K921 Melena: Secondary | ICD-10-CM | POA: Diagnosis not present

## 2020-08-07 DIAGNOSIS — K3189 Other diseases of stomach and duodenum: Secondary | ICD-10-CM | POA: Diagnosis not present

## 2020-08-07 DIAGNOSIS — K2289 Other specified disease of esophagus: Secondary | ICD-10-CM | POA: Diagnosis not present

## 2020-08-07 DIAGNOSIS — K2930 Chronic superficial gastritis without bleeding: Secondary | ICD-10-CM | POA: Diagnosis not present

## 2020-08-09 DIAGNOSIS — K219 Gastro-esophageal reflux disease without esophagitis: Secondary | ICD-10-CM | POA: Diagnosis not present

## 2020-08-09 DIAGNOSIS — E7800 Pure hypercholesterolemia, unspecified: Secondary | ICD-10-CM | POA: Diagnosis not present

## 2020-08-09 DIAGNOSIS — M19041 Primary osteoarthritis, right hand: Secondary | ICD-10-CM | POA: Diagnosis not present

## 2020-08-09 DIAGNOSIS — M179 Osteoarthritis of knee, unspecified: Secondary | ICD-10-CM | POA: Diagnosis not present

## 2020-08-14 DIAGNOSIS — K219 Gastro-esophageal reflux disease without esophagitis: Secondary | ICD-10-CM | POA: Diagnosis not present

## 2020-08-14 DIAGNOSIS — K2930 Chronic superficial gastritis without bleeding: Secondary | ICD-10-CM | POA: Diagnosis not present

## 2020-08-14 DIAGNOSIS — B9681 Helicobacter pylori [H. pylori] as the cause of diseases classified elsewhere: Secondary | ICD-10-CM | POA: Diagnosis not present

## 2020-08-18 DIAGNOSIS — Z20822 Contact with and (suspected) exposure to covid-19: Secondary | ICD-10-CM | POA: Diagnosis not present

## 2020-08-18 DIAGNOSIS — U071 COVID-19: Secondary | ICD-10-CM | POA: Diagnosis not present

## 2020-09-05 DIAGNOSIS — M47816 Spondylosis without myelopathy or radiculopathy, lumbar region: Secondary | ICD-10-CM | POA: Diagnosis not present

## 2020-09-11 ENCOUNTER — Ambulatory Visit (INDEPENDENT_AMBULATORY_CARE_PROVIDER_SITE_OTHER): Payer: Medicare Other | Admitting: Diagnostic Neuroimaging

## 2020-09-11 ENCOUNTER — Encounter: Payer: Self-pay | Admitting: *Deleted

## 2020-09-11 VITALS — BP 121/75 | HR 86 | Ht 64.0 in | Wt 200.4 lb

## 2020-09-11 DIAGNOSIS — M79662 Pain in left lower leg: Secondary | ICD-10-CM

## 2020-09-11 DIAGNOSIS — Z79899 Other long term (current) drug therapy: Secondary | ICD-10-CM

## 2020-09-11 DIAGNOSIS — R6889 Other general symptoms and signs: Secondary | ICD-10-CM

## 2020-09-11 DIAGNOSIS — G8929 Other chronic pain: Secondary | ICD-10-CM

## 2020-09-11 DIAGNOSIS — M48062 Spinal stenosis, lumbar region with neurogenic claudication: Secondary | ICD-10-CM

## 2020-09-11 DIAGNOSIS — M5442 Lumbago with sciatica, left side: Secondary | ICD-10-CM

## 2020-09-11 DIAGNOSIS — M79661 Pain in right lower leg: Secondary | ICD-10-CM | POA: Diagnosis not present

## 2020-09-11 DIAGNOSIS — M5441 Lumbago with sciatica, right side: Secondary | ICD-10-CM

## 2020-09-11 DIAGNOSIS — R200 Anesthesia of skin: Secondary | ICD-10-CM
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
## 2020-09-11 NOTE — Patient Instructions (Signed)
  INTERMITTENT POSITIONAL NUMBNESS / PAIN IN LEGS WITH STANDING / WALKING (likely related to mild lumbar spinal stenosis with neurogenic claudication) - continue per pain mgmt Dr. Mina Marble  NUMBNESS IN HANDS / FEET - could be mild superimposed neuropathy; check neuropathy labs

## 2020-09-12 ENCOUNTER — Telehealth: Payer: Self-pay | Admitting: *Deleted

## 2020-09-12 LAB — COMPREHENSIVE METABOLIC PANEL
ALT: 61 IU/L — ABNORMAL HIGH (ref 0–44)
AST: 31 IU/L (ref 0–40)
Albumin/Globulin Ratio: 1.2 (ref 1.2–2.2)
Albumin: 4.1 g/dL (ref 3.7–4.7)
Alkaline Phosphatase: 120 IU/L (ref 44–121)
BUN/Creatinine Ratio: 17 (ref 10–24)
BUN: 17 mg/dL (ref 8–27)
Bilirubin Total: 0.3 mg/dL (ref 0.0–1.2)
CO2: 23 mmol/L (ref 20–29)
Calcium: 9.2 mg/dL (ref 8.6–10.2)
Chloride: 102 mmol/L (ref 96–106)
Creatinine, Ser: 1.01 mg/dL (ref 0.76–1.27)
GFR calc Af Amer: 85 mL/min/{1.73_m2} (ref >59)
GFR calc non Af Amer: 73 mL/min/{1.73_m2} (ref >59)
Globulin, Total: 3.3 g/dL (ref 1.5–4.5)
Glucose: 95 mg/dL (ref 65–99)
Potassium: 4.6 mmol/L (ref 3.5–5.2)
Sodium: 139 mmol/L (ref 134–144)
Total Protein: 7.4 g/dL (ref 6.0–8.5)

## 2020-09-12 LAB — CBC WITH DIFFERENTIAL/PLATELET
Basophils Absolute: 0.1 10*3/uL (ref 0.0–0.2)
Basos: 1 %
EOS (ABSOLUTE): 0.3 10*3/uL (ref 0.0–0.4)
Eos: 3 %
Hematocrit: 48.3 % (ref 37.5–51.0)
Hemoglobin: 16 g/dL (ref 13.0–17.7)
Immature Grans (Abs): 0.1 10*3/uL (ref 0.0–0.1)
Immature Granulocytes: 1 %
Lymphocytes Absolute: 3.2 10*3/uL — ABNORMAL HIGH (ref 0.7–3.1)
Lymphs: 37 %
MCH: 29.7 pg (ref 26.6–33.0)
MCHC: 33.1 g/dL (ref 31.5–35.7)
MCV: 90 fL (ref 79–97)
Monocytes Absolute: 0.6 10*3/uL (ref 0.1–0.9)
Monocytes: 7 %
Neutrophils Absolute: 4.4 10*3/uL (ref 1.4–7.0)
Neutrophils: 51 %
Platelets: 241 10*3/uL (ref 150–450)
RBC: 5.39 x10E6/uL (ref 4.14–5.80)
RDW: 12.2 % (ref 11.6–15.4)
WBC: 8.6 10*3/uL (ref 3.4–10.8)

## 2020-09-12 LAB — TSH: TSH: 6.39 u[IU]/mL — ABNORMAL HIGH (ref 0.450–4.500)

## 2020-09-12 LAB — VITAMIN B12: Vitamin B-12: 708 pg/mL (ref 232–1245)

## 2020-09-12 LAB — HEMOGLOBIN A1C
Est. average glucose Bld gHb Est-mCnc: 128 mg/dL
Hgb A1c MFr Bld: 6.1 % — ABNORMAL HIGH (ref 4.8–5.6)

## 2020-09-13 DIAGNOSIS — Z125 Encounter for screening for malignant neoplasm of prostate: Secondary | ICD-10-CM | POA: Diagnosis not present

## 2020-09-13 DIAGNOSIS — E7800 Pure hypercholesterolemia, unspecified: Secondary | ICD-10-CM | POA: Diagnosis not present

## 2020-09-13 DIAGNOSIS — Z8719 Personal history of other diseases of the digestive system: Secondary | ICD-10-CM | POA: Diagnosis not present

## 2020-09-13 DIAGNOSIS — Z79899 Other long term (current) drug therapy: Secondary | ICD-10-CM | POA: Diagnosis not present

## 2020-09-13 DIAGNOSIS — R413 Other amnesia: Secondary | ICD-10-CM | POA: Diagnosis not present

## 2020-09-13 NOTE — Telephone Encounter (Signed)
Called daughter, Verdis Frederickson on [place] and informed her that most labs are okay. PCP needs to address TSH lab  Result which is elevated. She stated he saw PCP today and additional labs were ordered. She stated PCP couldn't see our labs; I advised will fax a copy to Dr Harrington Challenger.Maria verbalized understanding, appreciation. Labs faxed to Dr Harrington Challenger.

## 2020-09-14 ENCOUNTER — Telehealth: Payer: Self-pay

## 2020-09-14 NOTE — Telephone Encounter (Signed)
Office policy is to stay with the current physician. Thanks

## 2020-09-14 NOTE — Telephone Encounter (Signed)
I just saw patient for neuropathy and pain issues. MC, please setup follow up appt with me for memory evaluation. -VRP

## 2020-09-14 NOTE — Telephone Encounter (Signed)
Called daughter Verdis Frederickson on [place] to schedule appointment with Dr Leta Baptist for new concern of memory. She stated PCP recommended Dr Jaynee Eagles. I advised her of office policy to follow up with same provider. She stated she will discuss with her father and call back or stop by office. She verbalized understanding, appreciation.

## 2020-09-14 NOTE — Telephone Encounter (Signed)
This patient has seen Dr Leta Baptist in the past. We have received a new referral for him for memory concerns getting worse. The patient is requesting a provider switch from Dr Leta Baptist to Dr. Jaynee Eagles. Please advise

## 2020-09-19 DIAGNOSIS — M179 Osteoarthritis of knee, unspecified: Secondary | ICD-10-CM | POA: Diagnosis not present

## 2020-09-19 DIAGNOSIS — M19041 Primary osteoarthritis, right hand: Secondary | ICD-10-CM | POA: Diagnosis not present

## 2020-09-19 DIAGNOSIS — E7800 Pure hypercholesterolemia, unspecified: Secondary | ICD-10-CM | POA: Diagnosis not present

## 2020-09-19 DIAGNOSIS — K219 Gastro-esophageal reflux disease without esophagitis: Secondary | ICD-10-CM | POA: Diagnosis not present

## 2020-09-25 DIAGNOSIS — M47816 Spondylosis without myelopathy or radiculopathy, lumbar region: Secondary | ICD-10-CM | POA: Diagnosis not present

## 2020-10-09 DIAGNOSIS — B9681 Helicobacter pylori [H. pylori] as the cause of diseases classified elsewhere: Secondary | ICD-10-CM | POA: Diagnosis not present

## 2020-11-02 DIAGNOSIS — R946 Abnormal results of thyroid function studies: Secondary | ICD-10-CM | POA: Diagnosis not present

## 2020-11-02 DIAGNOSIS — Z8719 Personal history of other diseases of the digestive system: Secondary | ICD-10-CM | POA: Diagnosis not present

## 2020-11-21 DIAGNOSIS — R972 Elevated prostate specific antigen [PSA]: Secondary | ICD-10-CM | POA: Diagnosis not present

## 2021-01-22 DIAGNOSIS — M179 Osteoarthritis of knee, unspecified: Secondary | ICD-10-CM | POA: Diagnosis not present

## 2021-01-22 DIAGNOSIS — M19041 Primary osteoarthritis, right hand: Secondary | ICD-10-CM | POA: Diagnosis not present

## 2021-01-22 DIAGNOSIS — K219 Gastro-esophageal reflux disease without esophagitis: Secondary | ICD-10-CM | POA: Diagnosis not present

## 2021-01-22 DIAGNOSIS — E7800 Pure hypercholesterolemia, unspecified: Secondary | ICD-10-CM | POA: Diagnosis not present

## 2021-03-26 DIAGNOSIS — B029 Zoster without complications: Secondary | ICD-10-CM | POA: Diagnosis not present

## 2021-04-18 DIAGNOSIS — M19041 Primary osteoarthritis, right hand: Secondary | ICD-10-CM | POA: Diagnosis not present

## 2021-04-18 DIAGNOSIS — M179 Osteoarthritis of knee, unspecified: Secondary | ICD-10-CM | POA: Diagnosis not present

## 2021-04-18 DIAGNOSIS — E7800 Pure hypercholesterolemia, unspecified: Secondary | ICD-10-CM | POA: Diagnosis not present

## 2021-04-18 DIAGNOSIS — K219 Gastro-esophageal reflux disease without esophagitis: Secondary | ICD-10-CM | POA: Diagnosis not present

## 2021-07-07 DIAGNOSIS — M179 Osteoarthritis of knee, unspecified: Secondary | ICD-10-CM | POA: Diagnosis not present

## 2021-07-07 DIAGNOSIS — M19041 Primary osteoarthritis, right hand: Secondary | ICD-10-CM | POA: Diagnosis not present

## 2021-07-07 DIAGNOSIS — E7800 Pure hypercholesterolemia, unspecified: Secondary | ICD-10-CM | POA: Diagnosis not present

## 2021-07-07 DIAGNOSIS — K219 Gastro-esophageal reflux disease without esophagitis: Secondary | ICD-10-CM | POA: Diagnosis not present

## 2021-07-25 DIAGNOSIS — K648 Other hemorrhoids: Secondary | ICD-10-CM | POA: Diagnosis not present

## 2021-07-25 DIAGNOSIS — K529 Noninfective gastroenteritis and colitis, unspecified: Secondary | ICD-10-CM | POA: Diagnosis not present

## 2021-07-25 DIAGNOSIS — K5289 Other specified noninfective gastroenteritis and colitis: Secondary | ICD-10-CM | POA: Diagnosis not present

## 2021-07-25 DIAGNOSIS — K5730 Diverticulosis of large intestine without perforation or abscess without bleeding: Secondary | ICD-10-CM | POA: Diagnosis not present

## 2021-07-25 DIAGNOSIS — K6389 Other specified diseases of intestine: Secondary | ICD-10-CM | POA: Diagnosis not present

## 2021-07-25 DIAGNOSIS — K5100 Ulcerative (chronic) pancolitis without complications: Secondary | ICD-10-CM | POA: Diagnosis not present

## 2021-07-25 DIAGNOSIS — K633 Ulcer of intestine: Secondary | ICD-10-CM | POA: Diagnosis not present

## 2021-07-27 DIAGNOSIS — K5289 Other specified noninfective gastroenteritis and colitis: Secondary | ICD-10-CM | POA: Diagnosis not present

## 2021-08-27 DIAGNOSIS — E7800 Pure hypercholesterolemia, unspecified: Secondary | ICD-10-CM | POA: Diagnosis not present

## 2021-08-27 DIAGNOSIS — K219 Gastro-esophageal reflux disease without esophagitis: Secondary | ICD-10-CM | POA: Diagnosis not present

## 2021-08-27 DIAGNOSIS — M1990 Unspecified osteoarthritis, unspecified site: Secondary | ICD-10-CM | POA: Diagnosis not present

## 2021-08-30 DIAGNOSIS — Z0000 Encounter for general adult medical examination without abnormal findings: Secondary | ICD-10-CM | POA: Diagnosis not present

## 2021-08-30 DIAGNOSIS — Z79899 Other long term (current) drug therapy: Secondary | ICD-10-CM | POA: Diagnosis not present

## 2021-08-30 DIAGNOSIS — E7800 Pure hypercholesterolemia, unspecified: Secondary | ICD-10-CM | POA: Diagnosis not present

## 2021-09-20 DIAGNOSIS — L570 Actinic keratosis: Secondary | ICD-10-CM | POA: Diagnosis not present

## 2021-09-20 DIAGNOSIS — Z0000 Encounter for general adult medical examination without abnormal findings: Secondary | ICD-10-CM | POA: Diagnosis not present

## 2021-09-20 DIAGNOSIS — K219 Gastro-esophageal reflux disease without esophagitis: Secondary | ICD-10-CM | POA: Diagnosis not present

## 2021-09-20 DIAGNOSIS — R030 Elevated blood-pressure reading, without diagnosis of hypertension: Secondary | ICD-10-CM | POA: Diagnosis not present

## 2021-09-20 DIAGNOSIS — Z79899 Other long term (current) drug therapy: Secondary | ICD-10-CM | POA: Diagnosis not present

## 2021-09-20 DIAGNOSIS — M1990 Unspecified osteoarthritis, unspecified site: Secondary | ICD-10-CM | POA: Diagnosis not present

## 2021-09-20 DIAGNOSIS — E7800 Pure hypercholesterolemia, unspecified: Secondary | ICD-10-CM | POA: Diagnosis not present

## 2021-09-20 DIAGNOSIS — M19041 Primary osteoarthritis, right hand: Secondary | ICD-10-CM | POA: Diagnosis not present

## 2021-11-11 ENCOUNTER — Inpatient Hospital Stay (HOSPITAL_COMMUNITY): Payer: Medicare Other

## 2021-11-11 ENCOUNTER — Inpatient Hospital Stay (HOSPITAL_COMMUNITY)
Admission: EM | Admit: 2021-11-11 | Discharge: 2021-11-16 | DRG: 872 | Disposition: A | Discharge status: Home / Self Care | Payer: Medicare Other | Attending: Internal Medicine | Admitting: Internal Medicine

## 2021-11-11 ENCOUNTER — Emergency Department (HOSPITAL_COMMUNITY): Payer: Medicare Other

## 2021-11-11 ENCOUNTER — Other Ambulatory Visit: Payer: Self-pay

## 2021-11-11 ENCOUNTER — Encounter (HOSPITAL_COMMUNITY): Payer: Self-pay | Admitting: Emergency Medicine

## 2021-11-11 DIAGNOSIS — E1169 Type 2 diabetes mellitus with other specified complication: Secondary | ICD-10-CM | POA: Diagnosis present

## 2021-11-11 DIAGNOSIS — Z87891 Personal history of nicotine dependence: Secondary | ICD-10-CM | POA: Diagnosis not present

## 2021-11-11 DIAGNOSIS — M1990 Unspecified osteoarthritis, unspecified site: Secondary | ICD-10-CM | POA: Diagnosis present

## 2021-11-11 DIAGNOSIS — K4090 Unilateral inguinal hernia, without obstruction or gangrene, not specified as recurrent: Secondary | ICD-10-CM | POA: Diagnosis not present

## 2021-11-11 DIAGNOSIS — N400 Enlarged prostate without lower urinary tract symptoms: Secondary | ICD-10-CM | POA: Diagnosis present

## 2021-11-11 DIAGNOSIS — K5190 Ulcerative colitis, unspecified, without complications: Secondary | ICD-10-CM | POA: Diagnosis not present

## 2021-11-11 DIAGNOSIS — Z8616 Personal history of COVID-19: Secondary | ICD-10-CM

## 2021-11-11 DIAGNOSIS — Z6834 Body mass index (BMI) 34.0-34.9, adult: Secondary | ICD-10-CM

## 2021-11-11 DIAGNOSIS — R0602 Shortness of breath: Secondary | ICD-10-CM | POA: Diagnosis not present

## 2021-11-11 DIAGNOSIS — J9811 Atelectasis: Secondary | ICD-10-CM | POA: Diagnosis not present

## 2021-11-11 DIAGNOSIS — Z823 Family history of stroke: Secondary | ICD-10-CM | POA: Diagnosis not present

## 2021-11-11 DIAGNOSIS — Z87828 Personal history of other (healed) physical injury and trauma: Secondary | ICD-10-CM | POA: Diagnosis not present

## 2021-11-11 DIAGNOSIS — K219 Gastro-esophageal reflux disease without esophagitis: Secondary | ICD-10-CM | POA: Diagnosis not present

## 2021-11-11 DIAGNOSIS — A4151 Sepsis due to Escherichia coli [E. coli]: Principal | ICD-10-CM | POA: Diagnosis present

## 2021-11-11 DIAGNOSIS — A419 Sepsis, unspecified organism: Principal | ICD-10-CM | POA: Diagnosis present

## 2021-11-11 DIAGNOSIS — Z888 Allergy status to other drugs, medicaments and biological substances status: Secondary | ICD-10-CM

## 2021-11-11 DIAGNOSIS — Z8673 Personal history of transient ischemic attack (TIA), and cerebral infarction without residual deficits: Secondary | ICD-10-CM | POA: Diagnosis not present

## 2021-11-11 DIAGNOSIS — E119 Type 2 diabetes mellitus without complications: Secondary | ICD-10-CM | POA: Diagnosis not present

## 2021-11-11 DIAGNOSIS — Z20822 Contact with and (suspected) exposure to covid-19: Secondary | ICD-10-CM | POA: Diagnosis present

## 2021-11-11 DIAGNOSIS — I2510 Atherosclerotic heart disease of native coronary artery without angina pectoris: Secondary | ICD-10-CM | POA: Diagnosis not present

## 2021-11-11 DIAGNOSIS — N390 Urinary tract infection, site not specified: Secondary | ICD-10-CM | POA: Diagnosis not present

## 2021-11-11 DIAGNOSIS — E739 Lactose intolerance, unspecified: Secondary | ICD-10-CM | POA: Diagnosis present

## 2021-11-11 DIAGNOSIS — M4186 Other forms of scoliosis, lumbar region: Secondary | ICD-10-CM | POA: Diagnosis not present

## 2021-11-11 DIAGNOSIS — K760 Fatty (change of) liver, not elsewhere classified: Secondary | ICD-10-CM | POA: Diagnosis not present

## 2021-11-11 DIAGNOSIS — M7989 Other specified soft tissue disorders: Secondary | ICD-10-CM | POA: Diagnosis not present

## 2021-11-11 DIAGNOSIS — E785 Hyperlipidemia, unspecified: Secondary | ICD-10-CM | POA: Diagnosis not present

## 2021-11-11 DIAGNOSIS — E669 Obesity, unspecified: Secondary | ICD-10-CM | POA: Diagnosis present

## 2021-11-11 DIAGNOSIS — Z91040 Latex allergy status: Secondary | ICD-10-CM

## 2021-11-11 DIAGNOSIS — E7800 Pure hypercholesterolemia, unspecified: Secondary | ICD-10-CM | POA: Diagnosis present

## 2021-11-11 DIAGNOSIS — R0600 Dyspnea, unspecified: Secondary | ICD-10-CM | POA: Diagnosis not present

## 2021-11-11 DIAGNOSIS — Z833 Family history of diabetes mellitus: Secondary | ICD-10-CM | POA: Diagnosis not present

## 2021-11-11 DIAGNOSIS — Z79899 Other long term (current) drug therapy: Secondary | ICD-10-CM | POA: Diagnosis not present

## 2021-11-11 DIAGNOSIS — R600 Localized edema: Secondary | ICD-10-CM | POA: Diagnosis not present

## 2021-11-11 LAB — BLOOD CULTURE ID PANEL (REFLEXED) - BCID2

## 2021-11-11 LAB — CBC WITH DIFFERENTIAL/PLATELET
Abs Immature Granulocytes: 0.04 10*3/uL (ref 0.00–0.07)
Basophils Absolute: 0 10*3/uL (ref 0.0–0.1)
Basophils Relative: 0 %
Eosinophils Absolute: 0 10*3/uL (ref 0.0–0.5)
Eosinophils Relative: 0 %
HCT: 45.6 % (ref 39.0–52.0)
Hemoglobin: 15.3 g/dL (ref 13.0–17.0)
Immature Granulocytes: 0 %
Lymphocytes Relative: 18 %
Lymphs Abs: 1.6 10*3/uL (ref 0.7–4.0)
MCH: 30.1 pg (ref 26.0–34.0)
MCHC: 33.6 g/dL (ref 30.0–36.0)
MCV: 89.6 fL (ref 80.0–100.0)
Monocytes Absolute: 0.2 10*3/uL (ref 0.1–1.0)
Monocytes Relative: 2 %
Neutro Abs: 7.1 10*3/uL (ref 1.7–7.7)
Neutrophils Relative %: 80 %
Platelets: 125 10*3/uL — ABNORMAL LOW (ref 150–400)
RBC: 5.09 MIL/uL (ref 4.22–5.81)
RDW: 12.6 % (ref 11.5–15.5)
WBC: 9 10*3/uL (ref 4.0–10.5)
nRBC: 0 % (ref 0.0–0.2)

## 2021-11-11 LAB — RESP PANEL BY RT-PCR (FLU A&B, COVID) ARPGX2
Influenza A by PCR: NEGATIVE
Influenza B by PCR: NEGATIVE
SARS Coronavirus 2 by RT PCR: NEGATIVE

## 2021-11-11 LAB — COMPREHENSIVE METABOLIC PANEL
ALT: 25 U/L (ref 0–44)
AST: 24 U/L (ref 15–41)
Albumin: 3.3 g/dL — ABNORMAL LOW (ref 3.5–5.0)
Alkaline Phosphatase: 100 U/L (ref 38–126)
Anion gap: 12 (ref 5–15)
BUN: 17 mg/dL (ref 8–23)
CO2: 20 mmol/L — ABNORMAL LOW (ref 22–32)
Calcium: 8.5 mg/dL — ABNORMAL LOW (ref 8.9–10.3)
Chloride: 104 mmol/L (ref 98–111)
Creatinine, Ser: 1.27 mg/dL — ABNORMAL HIGH (ref 0.61–1.24)
GFR, Estimated: 59 mL/min — ABNORMAL LOW (ref >60)
Glucose, Bld: 178 mg/dL — ABNORMAL HIGH (ref 70–99)
Potassium: 3.1 mmol/L — ABNORMAL LOW (ref 3.5–5.1)
Sodium: 136 mmol/L (ref 135–145)
Total Bilirubin: 1.7 mg/dL — ABNORMAL HIGH (ref 0.3–1.2)
Total Protein: 7.2 g/dL (ref 6.5–8.1)

## 2021-11-11 LAB — URINALYSIS, ROUTINE W REFLEX MICROSCOPIC
Bacteria, UA: NONE SEEN
Bilirubin Urine: NEGATIVE
Glucose, UA: 50 mg/dL — AB
Ketones, ur: 5 mg/dL — AB
Nitrite: NEGATIVE
Protein, ur: 100 mg/dL — AB
Specific Gravity, Urine: 1.024 (ref 1.005–1.030)
WBC, UA: >50 WBC/hpf — ABNORMAL HIGH (ref 0–5)
pH: 5 (ref 5.0–8.0)

## 2021-11-11 LAB — LACTIC ACID, PLASMA
Lactic Acid, Venous: 2.8 mmol/L (ref 0.5–1.9)
Lactic Acid, Venous: 1.9 mmol/L (ref 0.5–1.9)

## 2021-11-11 LAB — PROTIME-INR
INR: 1 (ref 0.8–1.2)
Prothrombin Time: 13 seconds (ref 11.4–15.2)

## 2021-11-11 LAB — PROCALCITONIN: Procalcitonin: 1.01 ng/mL

## 2021-11-11 LAB — GLUCOSE, CAPILLARY: Glucose-Capillary: 148 mg/dL — ABNORMAL HIGH (ref 70–99)

## 2021-11-11 LAB — APTT: aPTT: 31 seconds (ref 24–36)

## 2021-11-11 MED ORDER — SODIUM CHLORIDE 0.9 % IV SOLN
2.0000 g | INTRAVENOUS | Status: AC
  Administered 2021-11-12 – 2021-11-15 (×4): 2 g via INTRAVENOUS
  Filled 2021-11-11 (×4): qty 20

## 2021-11-11 MED ORDER — INSULIN ASPART 100 UNIT/ML IJ SOLN
0.0000 [IU] | Freq: Three times a day (TID) | INTRAMUSCULAR | Status: DC
  Administered 2021-11-12 – 2021-11-14 (×4): 2 [IU] via SUBCUTANEOUS

## 2021-11-11 MED ORDER — ONDANSETRON HCL 4 MG PO TABS
4.0000 mg | ORAL_TABLET | Freq: Four times a day (QID) | ORAL | Status: DC | PRN
  Filled 2021-11-11: qty 1

## 2021-11-11 MED ORDER — SODIUM CHLORIDE 0.9% FLUSH
3.0000 mL | Freq: Two times a day (BID) | INTRAVENOUS | Status: DC
  Administered 2021-11-11 – 2021-11-15 (×9): 3 mL via INTRAVENOUS

## 2021-11-11 MED ORDER — LACTATED RINGERS IV BOLUS (SEPSIS)
1000.0000 mL | Freq: Once | INTRAVENOUS | Status: AC
  Administered 2021-11-11: 1000 mL via INTRAVENOUS

## 2021-11-11 MED ORDER — LACTATED RINGERS IV SOLN: INTRAVENOUS | Status: DC

## 2021-11-11 MED ORDER — POLYETHYLENE GLYCOL 3350 17 G PO PACK: 17.0000 g | PACK | Freq: Every day | ORAL | Status: DC | PRN

## 2021-11-11 MED ORDER — ONDANSETRON HCL 4 MG/2ML IJ SOLN
4.0000 mg | Freq: Four times a day (QID) | INTRAMUSCULAR | Status: DC | PRN
Start: 2021-11-11 — End: 2021-11-16

## 2021-11-11 MED ORDER — ACETAMINOPHEN 650 MG RE SUPP
650.0000 mg | Freq: Four times a day (QID) | RECTAL | Status: DC | PRN
Start: 2021-11-11 — End: 2021-11-16

## 2021-11-11 MED ORDER — ENOXAPARIN SODIUM 40 MG/0.4ML IJ SOSY
40.0000 mg | PREFILLED_SYRINGE | INTRAMUSCULAR | Status: DC
  Administered 2021-11-11 – 2021-11-16 (×6): 40 mg via SUBCUTANEOUS
  Filled 2021-11-11 (×6): qty 0.4

## 2021-11-11 MED ORDER — ATORVASTATIN CALCIUM 10 MG PO TABS
20.0000 mg | ORAL_TABLET | Freq: Every day | ORAL | Status: DC
Start: 2021-11-11 — End: 2021-11-16
  Administered 2021-11-11 – 2021-11-16 (×6): 20 mg via ORAL
  Filled 2021-11-11 (×6): qty 2

## 2021-11-11 MED ORDER — POLYVINYL ALCOHOL 1.4 % OP SOLN
1.0000 [drp] | OPHTHALMIC | Status: DC | PRN
  Administered 2021-11-13: 1 [drp] via OPHTHALMIC
  Filled 2021-11-11: qty 15

## 2021-11-11 MED ORDER — MESALAMINE ER 0.375 G PO CP24
1.5000 g | ORAL_CAPSULE | Freq: Every morning | ORAL | Status: DC
  Administered 2021-11-11 – 2021-11-16 (×6): 1.5 g via ORAL
  Filled 2021-11-11 (×7): qty 4

## 2021-11-11 MED ORDER — ONDANSETRON HCL 4 MG/2ML IJ SOLN
4.0000 mg | Freq: Once | INTRAMUSCULAR | Status: DC
  Filled 2021-11-11: qty 2

## 2021-11-11 MED ORDER — POTASSIUM CHLORIDE CRYS ER 20 MEQ PO TBCR
60.0000 meq | EXTENDED_RELEASE_TABLET | Freq: Once | ORAL | Status: AC
  Administered 2021-11-11: 60 meq via ORAL
  Filled 2021-11-11: qty 3

## 2021-11-11 MED ORDER — SODIUM CHLORIDE 0.9 % IV SOLN
2.0000 g | INTRAVENOUS | Status: DC
  Administered 2021-11-11: 2 g via INTRAVENOUS
  Filled 2021-11-11: qty 20

## 2021-11-11 MED ORDER — IOHEXOL 300 MG/ML  SOLN
100.0000 mL | Freq: Once | INTRAMUSCULAR | Status: AC | PRN
  Administered 2021-11-11: 100 mL via INTRAVENOUS

## 2021-11-11 MED ORDER — ACETAMINOPHEN 500 MG PO TABS
1000.0000 mg | ORAL_TABLET | Freq: Once | ORAL | Status: AC
  Administered 2021-11-11: 1000 mg via ORAL
  Filled 2021-11-11: qty 2

## 2021-11-11 MED ORDER — DOCUSATE SODIUM 100 MG PO CAPS
100.0000 mg | ORAL_CAPSULE | Freq: Two times a day (BID) | ORAL | Status: DC
  Administered 2021-11-11 – 2021-11-16 (×10): 100 mg via ORAL
  Filled 2021-11-11 (×11): qty 1

## 2021-11-11 MED ORDER — PANTOPRAZOLE SODIUM 40 MG PO TBEC
40.0000 mg | DELAYED_RELEASE_TABLET | Freq: Every day | ORAL | Status: DC
Start: 2021-11-11 — End: 2021-11-16
  Administered 2021-11-11 – 2021-11-16 (×6): 40 mg via ORAL
  Filled 2021-11-11 (×6): qty 1

## 2021-11-11 MED ORDER — ACETAMINOPHEN 325 MG PO TABS
650.0000 mg | ORAL_TABLET | Freq: Four times a day (QID) | ORAL | Status: DC | PRN
  Administered 2021-11-12 – 2021-11-14 (×5): 650 mg via ORAL
  Filled 2021-11-11 (×5): qty 2

## 2021-11-11 MED ORDER — CARBOXYMETHYLCELLULOSE SODIUM 0.5 % OP SOLN: 1.0000 [drp] | Freq: Three times a day (TID) | OPHTHALMIC | Status: DC | PRN

## 2021-11-11 MED ORDER — SODIUM CHLORIDE 0.9 % IV SOLN
500.0000 mg | INTRAVENOUS | Status: DC
  Administered 2021-11-11: 500 mg via INTRAVENOUS
  Filled 2021-11-11: qty 5

## 2021-11-11 MED ORDER — OXYCODONE HCL 5 MG PO TABS
5.0000 mg | ORAL_TABLET | ORAL | Status: DC | PRN
  Administered 2021-11-11 – 2021-11-14 (×2): 5 mg via ORAL
  Filled 2021-11-11 (×2): qty 1
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
## 2021-11-11 NOTE — Assessment & Plan Note (Signed)
-  Prior A1c was 6.1 ?-He does not appear to be taking medications for this issue ?-Cover with moderate-scale SSI ?

## 2021-11-11 NOTE — ED Notes (Signed)
Pt's meds sent to pharmacy to be dispensed while pt here. ?

## 2021-11-11 NOTE — Progress Notes (Signed)
PHARMACY - PHYSICIAN COMMUNICATION ?CRITICAL VALUE ALERT - BLOOD CULTURE IDENTIFICATION (BCID) ? ?Jack [name] is an 76 y.o. male who presented to [facility] on 11/11/2021 with a chief complaint of SOB ? ?Assessment:  E coli bacteremia - > no resistance ? ?Name of physician (or Provider) Contacted: [facility] ? ?Current antibiotics: Ceftriaxone 2 grams iv Q 24  ? ?Changes to prescribed antibiotics recommended:  ?Patient is on recommended antibiotics - No changes needed ? ?Results for orders placed or performed during the hospital encounter of 11/11/21  ?Blood Culture ID Panel (Reflexed) (Collected: 11/11/2021  4:53 AM)  ?Result Value Ref Range  ? Enterococcus faecalis NOT DETECTED NOT DETECTED  ? Enterococcus Faecium NOT DETECTED NOT DETECTED  ? Listeria monocytogenes NOT DETECTED NOT DETECTED  ? Staphylococcus species NOT DETECTED NOT DETECTED  ? Staphylococcus aureus (BCID) NOT DETECTED NOT DETECTED  ? Staphylococcus epidermidis NOT DETECTED NOT DETECTED  ? Staphylococcus lugdunensis NOT DETECTED NOT DETECTED  ? Streptococcus species NOT DETECTED NOT DETECTED  ? Streptococcus agalactiae NOT DETECTED NOT DETECTED  ? Streptococcus pneumoniae NOT DETECTED NOT DETECTED  ? Streptococcus pyogenes NOT DETECTED NOT DETECTED  ? A.calcoaceticus-baumannii NOT DETECTED NOT DETECTED  ? Bacteroides fragilis NOT DETECTED NOT DETECTED  ? Enterobacterales DETECTED (A) NOT DETECTED  ? Enterobacter cloacae complex NOT DETECTED NOT DETECTED  ? Escherichia coli DETECTED (A) NOT DETECTED  ? Klebsiella aerogenes NOT DETECTED NOT DETECTED  ? Klebsiella oxytoca NOT DETECTED NOT DETECTED  ? Klebsiella pneumoniae NOT DETECTED NOT DETECTED  ? Proteus species NOT DETECTED NOT DETECTED  ? Salmonella species NOT DETECTED NOT DETECTED  ? Serratia marcescens NOT DETECTED NOT DETECTED  ? Haemophilus influenzae NOT DETECTED NOT DETECTED  ? Neisseria meningitidis NOT DETECTED NOT DETECTED  ? Pseudomonas aeruginosa NOT DETECTED NOT DETECTED  ?  Stenotrophomonas maltophilia NOT DETECTED NOT DETECTED  ? Candida albicans NOT DETECTED NOT DETECTED  ? Candida auris NOT DETECTED NOT DETECTED  ? Candida glabrata NOT DETECTED NOT DETECTED  ? Candida krusei NOT DETECTED NOT DETECTED  ? Candida parapsilosis NOT DETECTED NOT DETECTED  ? Candida tropicalis NOT DETECTED NOT DETECTED  ? Cryptococcus neoformans/gattii NOT DETECTED NOT DETECTED  ? CTX-M ESBL NOT DETECTED NOT DETECTED  ? Carbapenem resistance IMP NOT DETECTED NOT DETECTED  ? Carbapenem resistance KPC NOT DETECTED NOT DETECTED  ? Carbapenem resistance NDM NOT DETECTED NOT DETECTED  ? Carbapenem resist OXA 48 LIKE NOT DETECTED NOT DETECTED  ? Carbapenem resistance VIM NOT DETECTED NOT DETECTED  ? ? ?Tad Moore ?11/11/2021  9:39 PM ? ?

## 2021-11-11 NOTE — ED Notes (Signed)
Called lab to have procalcitonin added to previous collection ?

## 2021-11-11 NOTE — Plan of Care (Signed)

## 2021-11-11 NOTE — Sepsis Progress Note (Signed)
[facility] following Sepsis Protocol 

## 2021-11-11 NOTE — ED Notes (Signed)
Assumed pt care at this time

## 2021-11-11 NOTE — Assessment & Plan Note (Signed)
-  Continue Lipitor °

## 2021-11-11 NOTE — Assessment & Plan Note (Signed)
-  Continue mesalamine ?

## 2021-11-11 NOTE — ED Provider Notes (Signed)
?[facility] ?Provider Note ? ? ?CSN: [id number] ?Arrival date & time: 11/11/21  0419 ? ?  ? ?History ? ?Chief Complaint  ?Patient presents with  ? Shortness of Breath  ? ? ?Jack [name] is a 76 y.o. male. ? ?76 year old male who comes in with his daughter and son-in-law for feeling unwell.  Some of the patient had dysuria and increased frequency for the last 3 to 4 days.  Tonight felt short of breath and looked unwell very pale couple episodes of nausea but no real vomiting.  Was hot at home and shaking all over.  He was given some Tylenol did not get any better brought him here for further evaluation.  No cough but has been short of breath.  Had COVID last year but no other recent illnesses.  Has history of Crohn's but does not have any abdominal pain or changes in his abdominal distention at this time. ? ? ?Shortness of Breath ? ?  ? ?Home Medications ?Prior to Admission medications   ?Medication Sig Start Date End Date Taking? Authorizing Provider  ?acetaminophen (TYLENOL) 500 MG tablet Take 500 mg by mouth every 6 (six) hours as needed for moderate pain.   Yes [provider]  ?atorvastatin (LIPITOR) 20 MG tablet Take 20 mg daily by mouth. 05/06/17  Yes [provider]  ?carboxymethylcellulose (REFRESH PLUS) 0.5 % SOLN 1 drop 3 (three) times daily as needed (dry eyes).   Yes [provider]  ?mesalamine (APRISO) 0.375 g 24 hr capsule Take 1.5 g by mouth every morning. 10/25/21  Yes [provider]  ?pantoprazole (PROTONIX) 40 MG tablet Take 40 mg daily by mouth. 06/01/17  Yes [provider]  ?   ? ?Allergies    ?Latex and Other   ? ?Review of Systems   ?Review of Systems  ?Respiratory:  Positive for shortness of breath.   ? ?Physical Exam ?Updated Vital Signs ?BP (!) 157/76   Pulse (!) 138   Temp (!) 102.9 ?F (39.4 ?C) (Oral)   Resp 20   Ht '5\' 4"'$  (1.626 m)   Wt 90.9 kg   SpO2 95%   BMI 34.40 kg/m?  ?Physical Exam ?Vitals  and nursing note reviewed.  ?Constitutional:   ?   Appearance: He is well-developed.  ?HENT:  ?   Head: Normocephalic and atraumatic.  ?Cardiovascular:  ?   Rate and Rhythm: Normal rate.  ?Pulmonary:  ?   Effort: Pulmonary effort is normal. No respiratory distress.  ?   Breath sounds: No wheezing or rhonchi.  ?Chest:  ?   Chest wall: No mass.  ?Abdominal:  ?   General: There is no distension.  ?   Tenderness: There is no abdominal tenderness. There is no guarding or rebound.  ?Musculoskeletal:     ?   General: Normal range of motion.  ?   Cervical back: Normal range of motion.  ?   Right lower leg: No edema.  ?   Left lower leg: No edema.  ?Skin: ?   General: Skin is warm and dry.  ?Neurological:  ?   General: No focal deficit present.  ?   Mental Status: He is alert.  ? ? ?ED Results / Procedures / Treatments   ?Labs ?(all labs ordered are listed, but only abnormal results are displayed) ?Labs Reviewed  ?COMPREHENSIVE METABOLIC PANEL - Abnormal; Notable for the following components:  ?    Result Value  ? Potassium 3.1 (*)   ?  CO2 20 (*)   ? Glucose, Bld 178 (*)   ? Creatinine, Ser 1.27 (*)   ? Calcium 8.5 (*)   ? Albumin 3.3 (*)   ? Total Bilirubin 1.7 (*)   ? GFR, Estimated 59 (*)   ? All other components within normal limits  ?LACTIC ACID, PLASMA - Abnormal; Notable for the following components:  ? Lactic Acid, Venous 2.8 (*)   ? All other components within normal limits  ?CBC WITH DIFFERENTIAL/PLATELET - Abnormal; Notable for the following components:  ? Platelets 125 (*)   ? All other components within normal limits  ?URINALYSIS, ROUTINE W REFLEX MICROSCOPIC - Abnormal; Notable for the following components:  ? Color, Urine AMBER (*)   ? APPearance CLOUDY (*)   ? Glucose, UA 50 (*)   ? Hgb urine dipstick MODERATE (*)   ? Ketones, ur 5 (*)   ? Protein, ur 100 (*)   ? Leukocytes,Ua MODERATE (*)   ? WBC, UA >50 (*)   ? All other components within normal limits  ?CULTURE, BLOOD (ROUTINE X 2)  ?CULTURE, BLOOD  (ROUTINE X 2)  ?RESP PANEL BY RT-PCR (FLU A&B, COVID) ARPGX2  ?URINE CULTURE  ?PROTIME-INR  ?LACTIC ACID, PLASMA  ?APTT  ? ? ?EKG ?None ? ?Radiology ?DG Chest 2 View ? ?Result Date: 11/11/2021 ?CLINICAL DATA:  Shortness of breath EXAM: CHEST - 2 VIEW COMPARISON:  06/15/2009 FINDINGS: Low volume chest with indistinct density at the bases. Stable generous heart size likely accentuated by fat pad at the apex. Mild aortic tortuosity and stable hila. IMPRESSION: Indistinct density at the bases which could be atelectasis or infiltrate. Electronically Signed   By: Jorje Guild M.D.   On: 11/11/2021 05:28   ? ?Procedures ?Marland KitchenCritical Care ?Performed by: Merrily Pew, MD ?Authorized by: Merrily Pew, MD  ? ?Critical care provider statement:  ?  Critical care time (minutes):  30 ?  Critical care was necessary to treat or prevent imminent or life-threatening deterioration of the following conditions:  Sepsis ?  Critical care was time spent personally by me on the following activities:  Development of treatment plan with patient or surrogate, discussions with consultants, evaluation of patient's response to treatment, examination of patient, ordering and review of laboratory studies, ordering and review of radiographic studies, ordering and performing treatments and interventions, pulse oximetry, re-evaluation of patient's condition and review of old charts  ? ? ?Medications Ordered in ED ?Medications  ?lactated ringers infusion (has no administration in time range)  ?lactated ringers bolus 1,000 mL (has no administration in time range)  ?  And  ?lactated ringers bolus 1,000 mL (has no administration in time range)  ?  And  ?lactated ringers bolus 1,000 mL (has no administration in time range)  ?cefTRIAXone (ROCEPHIN) 2 g in sodium chloride 0.9 % 100 mL IVPB (has no administration in time range)  ?azithromycin (ZITHROMAX) 500 mg in sodium chloride 0.9 % 250 mL IVPB (has no administration in time range)  ?ondansetron (ZOFRAN)  injection 4 mg (has no administration in time range)  ?acetaminophen (TYLENOL) tablet 1,000 mg (1,000 mg Oral Given 11/11/21 0439)  ? ? ?ED Course/ Medical Decision Making/ A&P ?  ?                        ?Medical Decision Making ?Amount and/or Complexity of Data Reviewed ?Labs: ordered. ?Radiology: ordered. ?ECG/medicine tests: ordered. ? ?Risk ?OTC drugs. ?Prescription drug management. ?Decision regarding hospitalization. ? ? ?Sepsis  activated. Lungs with possible opacities, but sound clear and no recent cough. Suspect UTI based on story but will give rocephin azithro to cover for both. 2/2 age and sudden worsening/sepsis will discuss with [facility] for admission.  ? ?Final Clinical Impression(s) / ED Diagnoses ?Final diagnoses:  ?Sepsis, due to unspecified organism, unspecified whether acute organ dysfunction present [facility])  ?Urinary tract infection without hematuria, site unspecified  ? ? ?Rx / DC Orders ?ED Discharge Orders   ? ? None  ? ?  ? ? ?  ?Merrily Pew, MD ?11/12/21 [phone] ? ?

## 2021-11-11 NOTE — ED Triage Notes (Signed)
Spanish speaker pt c/o SOB, fever, nausea and vomiting, pain with urination since yesterday. ?

## 2021-11-11 NOTE — Progress Notes (Signed)
Assumed care for patient. Pt alert and oriented. Admission assessment completed and pt made comfortable. Daughter at bedside.  ?

## 2021-11-11 NOTE — Assessment & Plan Note (Signed)
-  SIRS criteria in this patient includes: Fever, tachycardia, tachypnea  ?-Patient has evidence of acute organ failure with elevated lactate >2; recurrent hypotension (SBP < 90 or MAP < 65 x 2 readings) that is not easily explained by another condition. ?-While awaiting blood cultures, this appears to be a preseptic condition. ?-Sepsis protocol initiated ?-Patient had SBP <90/MAP <65 and so has received the 30 cc/kg IVF bolus. ?-Suspected source is UTI; while he did present with SOB and hypoxia, this is likely related to sepsis rather than a pulmonary source. ?-Blood and urine cultures pending ?-Will admit due to: hemodynamic instability; he has been placed in progressive care ?-Treat with IV Rocephin for presumed urinary source; he also received a dose of Azithromycin in the ER ?-Lactate has normalized ?-Procalcitonin level is elevated >1.  Antibiotics would not be indicated for PCT <0.1 and probably should not be used for < 0.25.  >0.5 indicates infection and >>0.5 indicates more serious disease.  As the procalcitonin level normalizes, it will be reasonable to consider de-escalation of antibiotic coverage. ?

## 2021-11-12 DIAGNOSIS — E1169 Type 2 diabetes mellitus with other specified complication: Secondary | ICD-10-CM | POA: Diagnosis not present

## 2021-11-12 DIAGNOSIS — N390 Urinary tract infection, site not specified: Secondary | ICD-10-CM | POA: Diagnosis not present

## 2021-11-12 DIAGNOSIS — E669 Obesity, unspecified: Secondary | ICD-10-CM

## 2021-11-12 DIAGNOSIS — A419 Sepsis, unspecified organism: Secondary | ICD-10-CM | POA: Diagnosis not present

## 2021-11-12 LAB — BASIC METABOLIC PANEL
Anion gap: 8 (ref 5–15)
BUN: 15 mg/dL (ref 8–23)
CO2: 22 mmol/L (ref 22–32)
Calcium: 7.8 mg/dL — ABNORMAL LOW (ref 8.9–10.3)
Chloride: 105 mmol/L (ref 98–111)
Creatinine, Ser: 1.04 mg/dL (ref 0.61–1.24)
GFR, Estimated: >60 mL/min (ref >60)
Glucose, Bld: 159 mg/dL — ABNORMAL HIGH (ref 70–99)
Potassium: 3.4 mmol/L — ABNORMAL LOW (ref 3.5–5.1)
Sodium: 135 mmol/L (ref 135–145)

## 2021-11-12 LAB — CBC
HCT: 37.6 % — ABNORMAL LOW (ref 39.0–52.0)
Hemoglobin: 12.6 g/dL — ABNORMAL LOW (ref 13.0–17.0)
MCH: 29.9 pg (ref 26.0–34.0)
MCHC: 33.5 g/dL (ref 30.0–36.0)
MCV: 89.1 fL (ref 80.0–100.0)
Platelets: 100 10*3/uL — ABNORMAL LOW (ref 150–400)
RBC: 4.22 MIL/uL (ref 4.22–5.81)
RDW: 12.7 % (ref 11.5–15.5)
WBC: 11.8 10*3/uL — ABNORMAL HIGH (ref 4.0–10.5)
nRBC: 0 % (ref 0.0–0.2)

## 2021-11-12 LAB — GLUCOSE, CAPILLARY
Glucose-Capillary: 127 mg/dL — ABNORMAL HIGH (ref 70–99)
Glucose-Capillary: 136 mg/dL — ABNORMAL HIGH (ref 70–99)
Glucose-Capillary: 102 mg/dL — ABNORMAL HIGH (ref 70–99)
Glucose-Capillary: 100 mg/dL — ABNORMAL HIGH (ref 70–99)
Glucose-Capillary: 121 mg/dL — ABNORMAL HIGH (ref 70–99)

## 2021-11-12 MED ORDER — POTASSIUM CHLORIDE CRYS ER 20 MEQ PO TBCR
60.0000 meq | EXTENDED_RELEASE_TABLET | Freq: Once | ORAL | Status: AC
  Administered 2021-11-12: 60 meq via ORAL
  Filled 2021-11-12: qty 3

## 2021-11-12 MED ORDER — IBUPROFEN 600 MG PO TABS
600.0000 mg | ORAL_TABLET | ORAL | Status: AC
  Administered 2021-11-12: 600 mg via ORAL
  Filled 2021-11-12: qty 1

## 2021-11-12 NOTE — Progress Notes (Signed)
?PROGRESS NOTE ? ? ? ?Jack [name]  VEL:[id number] DOB: 08-21-1945 DOA: 11/11/2021 ?PCP: Lawerance Cruel, MD  ? ?Chief Complaint  ?Patient presents with  ? Shortness of Breath  ? ? ?Brief Narrative:  ? ? ?Jack [name] is a 76 y.o. male with medical history significant of IBD; DM; HLD presenting with SOB.  His daughter reports that he took 2 Tylenol at 7pm because he had fever.  He had been complaining of pain with urination x 4 days.  About 9pm his daughter gave him cranberry juice and water - he ate little yesterday due to nausea.  About 3-4 AM, he complained of SOB and they gave him O2 (from his wife).  He was pale and shivering.  He had a normal BM but continued to feel SOB.  Fever here 102.  Fever started 4 days ago.  No cough.  No respiratory symptoms other than SOB. ?  ?  ?  ?ER Course:  Pyelo.  Urinary symptoms x 4 days, got weak and listless with tachypnea, T 102.9.  No respiratory symptoms but abnl CXR so given Rocephin, Azithro.   ? ?Assessment & Plan: ?  ?Principal Problem: ?  Sepsis secondary to UTI [facility]) ?Active Problems: ?  Dyslipidemia ?  Ulcerative colitis ([place]) ?  Diabetes mellitus type 2 in obese [facility]) ? ?Sepsis secondary to e coli UTI/bacteremia ?-Sepsis present on admission: Fever, tachycardia, tachypnea, and elevated lactic acid at 2.8 ?-Sepsis is related to UTI, as well bacteremia ?-Continue with IV antibiotics. ?-Continue with IV fluids ?-Continue to trend procalcitonin ? ? ?Diabetes mellitus type 2 in obese [facility]) ?-Prior A1c was 6.1 ?-He does not appear to be taking medications for this issue ?-Cover with moderate-scale SSI ?  ?Ulcerative colitis ([place]) ?-Continue mesalamine ?  ?Dyslipidemia ?-Continue Lipitor ?  ?  ?  ? ? ?DVT prophylaxis: Lovenox ?Code Status: Full ?Family Communication: D/W grandson at bedside ?Disposition:  ? ?Status is: Inpatient ?Remains inpatient appropriate because: bacteremia with IV AABX ?  ?Consultants:  ?None ? ? ?Subjective: ? ?He still reports  dysuria, and chills ? ?Objective: ?Vitals:  ? 11/12/21 0000 11/12/21 0411 11/12/21 0714 11/12/21 1148  ?BP: 131/74 112/64 119/74 127/72  ?Pulse: 96 91 89 88  ?Resp: (!) 23 20 (!) 21 15  ?Temp:  100 ?F (37.8 ?C) 99.5 ?F (37.5 ?C) 99.5 ?F (37.5 ?C)  ?TempSrc:  Oral Oral Oral  ?SpO2: 93% 94% 95% 92%  ?Weight:      ?Height:      ? ? ?Intake/Output Summary (Last 24 hours) at 11/12/2021 1450 ?Last data filed at 11/12/2021 1428 ?Gross per 24 hour  ?Intake --  ?Output 500 ml  ?Net -500 ml  ? ?Filed Weights  ? 11/11/21 0434  ?Weight: 90.9 kg  ? ? ?Examination: ? ?General exam: Appears calm and comfortable  ?Respiratory system: Clear to auscultation. Respiratory effort normal. ?Cardiovascular system: S1 & S2 heard, RRR. No JVD, murmurs, rubs, gallops or clicks. No pedal edema. ?Gastrointestinal system: Abdomen is nondistended, soft and nontender. No organomegaly or masses felt. Normal bowel sounds heard. ?Central nervous system: Alert and oriented. No focal neurological deficits. ?Extremities: Symmetric 5 x 5 power. ?Skin: No rashes, lesions or ulcers ?Psychiatry: Judgement and insight appear normal. Mood & affect appropriate.  ? ? ? ?Data Reviewed: I have personally reviewed following labs and imaging studies ? ?CBC: ?Recent Labs  ?Lab 11/11/21 ?0453 11/12/21 ?0131  ?WBC 9.0 11.8*  ?NEUTROABS 7.1  --   ?HGB 15.3 12.6*  ?  HCT 45.6 37.6*  ?MCV 89.6 89.1  ?PLT 125* 100*  ? ? ?Basic Metabolic Panel: ?Recent Labs  ?Lab 11/11/21 ?0453 11/12/21 ?0131  ?NA 136 135  ?K 3.1* 3.4*  ?CL 104 105  ?CO2 20* 22  ?GLUCOSE 178* 159*  ?BUN 17 15  ?CREATININE 1.27* 1.04  ?CALCIUM 8.5* 7.8*  ? ? ?GFR: ?Estimated Creatinine Clearance: 63.4 mL/min (by C-G formula based on SCr of 1.04 mg/dL). ? ?Liver Function Tests: ?Recent Labs  ?Lab 11/11/21 ?7425  ?AST 24  ?ALT 25  ?ALKPHOS 100  ?BILITOT 1.7*  ?PROT 7.2  ?ALBUMIN 3.3*  ? ? ?CBG: ?Recent Labs  ?Lab 11/11/21 ?2204 11/12/21 ?0717 11/12/21 ?1219  ?GLUCAP 148* 127* 136*  ? ? ? ?Recent Results (from the  past 240 hour(s))  ?Culture, blood (Routine x 2)     Status: Abnormal (Preliminary result)  ? Collection Time: 11/11/21  4:53 AM  ? Specimen: BLOOD RIGHT ARM  ?Result Value Ref Range Status  ? Specimen Description BLOOD RIGHT ARM  Final  ? Special Requests   Final  ?  BOTTLES DRAWN AEROBIC AND ANAEROBIC Blood Culture adequate volume  ? Culture  Setup Time   Final  ?  GRAM NEGATIVE RODS ?IN BOTH AEROBIC AND ANAEROBIC BOTTLES ?CRITICAL RESULT CALLED TO, READ BACK BY AND VERIFIED WITH: ?PHARMD LISA POWELL 11/11/21'@21'$ :30 BY TW ?  ? Culture (A)  Final  ?  ESCHERICHIA COLI ?SUSCEPTIBILITIES TO FOLLOW ?Performed at [facility], [place] [address] [place], [place] [address] ?  ? Report Status PENDING  Incomplete  ?Culture, blood (Routine x 2)     Status: None (Preliminary result)  ? Collection Time: 11/11/21  4:53 AM  ? Specimen: BLOOD RIGHT HAND  ?Result Value Ref Range Status  ? Specimen Description BLOOD RIGHT HAND  Final  ? Special Requests AEROBIC BOTTLE ONLY Blood Culture adequate volume  Final  ? Culture   Final  ?  NO GROWTH 1 DAY ?Performed at [facility], [place] [address], [place], [place] [address] ?  ? Report Status PENDING  Incomplete  ?Urine Culture     Status: Abnormal (Preliminary result)  ? Collection Time: 11/11/21  4:53 AM  ? Specimen: In/Out Cath Urine  ?Result Value Ref Range Status  ? Specimen Description IN/OUT CATH URINE  Final  ? Special Requests NONE  Final  ? Culture (A)  Final  ?  >=100,000 COLONIES/mL GRAM NEGATIVE RODS ?IDENTIFICATION AND SUSCEPTIBILITIES TO FOLLOW ?Performed at [facility], [place] [address], [place], [place] [address] ?  ? Report Status PENDING  Incomplete  ?Blood Culture ID Panel (Reflexed)     Status: Abnormal  ? Collection Time: 11/11/21  4:53 AM  ?Result Value Ref Range Status  ? Enterococcus faecalis NOT DETECTED NOT DETECTED Final  ? Enterococcus Faecium NOT DETECTED NOT DETECTED Final  ? Listeria monocytogenes NOT DETECTED NOT DETECTED Final  ?  Staphylococcus species NOT DETECTED NOT DETECTED Final  ? Staphylococcus aureus (BCID) NOT DETECTED NOT DETECTED Final  ? Staphylococcus epidermidis NOT DETECTED NOT DETECTED Final  ? Staphylococcus lugdunensis NOT DETECTED NOT DETECTED Final  ? Streptococcus species NOT DETECTED NOT DETECTED Final  ? Streptococcus agalactiae NOT DETECTED NOT DETECTED Final  ? Streptococcus pneumoniae NOT DETECTED NOT DETECTED Final  ? Streptococcus pyogenes NOT DETECTED NOT DETECTED Final  ? A.calcoaceticus-baumannii NOT DETECTED NOT DETECTED Final  ? Bacteroides fragilis NOT DETECTED NOT DETECTED Final  ? Enterobacterales DETECTED (A) NOT DETECTED Final  ?  Comment: Enterobacterales represent a large order of gram negative bacteria, not a single organism. ?CRITICAL RESULT CALLED TO, READ BACK BY AND VERIFIED WITH: ?PHARMD LISA POWELL 11/11/21'@21'$ :30 BY TW ?  ? Enterobacter cloacae complex NOT DETECTED NOT DETECTED Final  ? Escherichia coli DETECTED (A) NOT DETECTED Final  ?  Comment: CRITICAL RESULT CALLED TO, READ BACK BY AND VERIFIED WITH: ?PHARMD LISA POWELL 11/11/21'@21'$ :30 BY TW ?  ? Klebsiella aerogenes NOT DETECTED NOT DETECTED Final  ? Klebsiella oxytoca NOT DETECTED NOT DETECTED Final  ? Klebsiella pneumoniae NOT DETECTED NOT DETECTED Final  ? Proteus species NOT DETECTED NOT DETECTED Final  ? Salmonella species NOT DETECTED NOT DETECTED Final  ? Serratia marcescens NOT DETECTED NOT DETECTED Final  ? Haemophilus influenzae NOT DETECTED NOT DETECTED Final  ? Neisseria meningitidis NOT DETECTED NOT DETECTED Final  ? Pseudomonas aeruginosa NOT DETECTED NOT DETECTED Final  ? Stenotrophomonas maltophilia NOT DETECTED NOT DETECTED Final  ? Candida albicans NOT DETECTED NOT DETECTED Final  ? Candida auris NOT DETECTED NOT DETECTED Final  ? Candida glabrata NOT DETECTED NOT DETECTED Final  ? Candida krusei NOT DETECTED NOT DETECTED Final  ? Candida parapsilosis NOT DETECTED NOT DETECTED Final  ? Candida tropicalis NOT DETECTED NOT  DETECTED Final  ? Cryptococcus neoformans/gattii NOT DETECTED NOT DETECTED Final  ? CTX-M ESBL NOT DETECTED NOT DETECTED Final  ? Carbapenem resistance IMP NOT DETECTED NOT DETECTED Final  ? Carbapenem resistance KPC NOT

## 2021-11-12 NOTE — Plan of Care (Signed)

## 2021-11-12 NOTE — Progress Notes (Signed)
Pt shaking/shivering.  No neurological changes noted.  Vitals obtained - temp 99.4, CBG 102.  MD notified, oral Tylenol administered.  Will continue to monitor.   ?

## 2021-11-12 NOTE — Progress Notes (Signed)
?   11/12/21 1923  ?Assess: MEWS Score  ?Temp (!) 101.1 ?F (38.4 ?C)  ?BP (!) 156/77  ?Pulse Rate (!) 105  ?ECG Heart Rate (!) 105  ?Resp 18  ?Level of Consciousness Alert  ?SpO2 93 %  ?O2 Device Room Air  ?Assess: MEWS Score  ?MEWS Temp 1  ?MEWS Systolic 0  ?MEWS Pulse 1  ?MEWS RR 0  ?MEWS LOC 0  ?MEWS Score 2  ?MEWS Score Color Yellow  ?Treat  ?Pain Scale 0-10  ?Pain Score 0  ?Notify: Charge Nurse/RN  ?Name of Charge Nurse/RN Notified April RN  ?Date Charge Nurse/RN Notified 11/12/21  ?Time Charge Nurse/RN Notified 1929  ?Notify: Provider  ?Provider Name/Title C. Hall DO  ?Date Provider Notified 11/12/21  ?Time Provider Notified 1929  ?Notification Type  ?(secure message)  ?Notification Reason Other (Comment) ?(Fever)  ?Provider response Other (Comment) ?(Give Acetaminophen PRN now)  ?Date of Provider Response 11/12/21  ?Time of Provider Response 1930  ? ? ?

## 2021-11-12 NOTE — Progress Notes (Signed)
?  Transition of Care (TOC) Screening Note ? ? ?Patient Details  ?Name: Jack [name] ?Date of Birth: 10-24-1945 ? ? ?Transition of Care (TOC) CM/SW Contact:    ?Benard Halsted, LCSW ?Phone Number: ?11/12/2021, 8:36 AM ? ? ? ?Transition of Care Department [facility]) has reviewed patient and no TOC needs have been identified at this time. We will continue to monitor patient advancement through interdisciplinary progression rounds. If new patient transition needs arise, please place a TOC consult. ? ? ?

## 2021-11-12 NOTE — Progress Notes (Signed)
?   11/12/21 1923 11/12/21 2135  ?Assess: MEWS Score  ?Temp (!) 101.1 ?F (38.4 ?C) (!) 101.6 ?F (38.7 ?C)  ?Assess: MEWS Score  ?MEWS Temp 1 2  ?MEWS Systolic 0 0  ?MEWS Pulse 1 1  ?MEWS RR 0 0  ?MEWS LOC 0 0  ?MEWS Score 2 3  ?MEWS Score Color Yellow Yellow  ?Assess: if the MEWS score is Yellow or Red  ?Were vital signs taken at a resting state?  --  Yes  ?Focused Assessment  --  No change from prior assessment  ?Early Detection of Sepsis Score *See Row Information*  --  Low  ?MEWS guidelines implemented *See Row Information*  --  No, previously yellow, continue vital signs every 4 hours  ?Notify: Provider  ?Provider Name/Title  --  C hall DO  ?Date Provider Notified  --  11/12/21  ?Time Provider Notified  --  2142  ?Notification Type  --   ?(secure message)  ?Notification Reason  --  Other (Comment) ?(Temperature)  ?Provider response  --  See new orders  ?Date of Provider Response  --  11/12/21  ?Time of Provider Response  --  2143  ? ? ?

## 2021-11-13 ENCOUNTER — Inpatient Hospital Stay (HOSPITAL_COMMUNITY): Payer: Medicare Other

## 2021-11-13 DIAGNOSIS — M7989 Other specified soft tissue disorders: Secondary | ICD-10-CM

## 2021-11-13 DIAGNOSIS — N390 Urinary tract infection, site not specified: Secondary | ICD-10-CM | POA: Diagnosis not present

## 2021-11-13 DIAGNOSIS — E785 Hyperlipidemia, unspecified: Secondary | ICD-10-CM | POA: Diagnosis not present

## 2021-11-13 DIAGNOSIS — A419 Sepsis, unspecified organism: Secondary | ICD-10-CM | POA: Diagnosis not present

## 2021-11-13 DIAGNOSIS — E1169 Type 2 diabetes mellitus with other specified complication: Secondary | ICD-10-CM | POA: Diagnosis not present

## 2021-11-13 LAB — URINE CULTURE: Culture: >=100000 — AB

## 2021-11-13 LAB — CULTURE, BLOOD (ROUTINE X 2): Special Requests: ADEQUATE

## 2021-11-13 LAB — GLUCOSE, CAPILLARY
Glucose-Capillary: 119 mg/dL — ABNORMAL HIGH (ref 70–99)
Glucose-Capillary: 106 mg/dL — ABNORMAL HIGH (ref 70–99)
Glucose-Capillary: 127 mg/dL — ABNORMAL HIGH (ref 70–99)
Glucose-Capillary: 157 mg/dL — ABNORMAL HIGH (ref 70–99)

## 2021-11-13 NOTE — Progress Notes (Signed)
Right upper extremity venous duplex completed. ?Refer to "CV Proc" under chart review to view preliminary results. ? ?11/13/2021 2:53 PM ?Kelby Aline., MHA, RVT, RDCS, RDMS   ?

## 2021-11-13 NOTE — Plan of Care (Signed)

## 2021-11-13 NOTE — Care Management Important Message (Signed)
Important Message ? ?Patient Details  ?Name: Jack [name] ?MRN: [id number] ?Date of Birth: 1946-01-22 ? ? ?Medicare Important Message Given:  Yes ? ? ? ? ?Sabella Traore ?11/13/2021, 2:23 PM ?

## 2021-11-13 NOTE — Progress Notes (Addendum)
?PROGRESS NOTE ? ? ? ?Jack [name]  SLH:[id number] DOB: [birth year] DOA: 11/11/2021 ?PCP: Lawerance Cruel, MD  ? ?Chief Complaint  ?Patient presents with  ? Shortness of Breath  ? ? ?Brief Narrative:  ? ? ?Jack [name] is a 76 y.o. male with medical history significant of IBD; DM; HLD presenting with SOB.  While he was noted to have fever, dysuria and polyuria, his work-up was significant for sepsis secondary to UTI admitted for further management.  ?.   ? ?Assessment & Plan: ?  ?Principal Problem: ?  Sepsis secondary to UTI [facility]) ?Active Problems: ?  Dyslipidemia ?  Ulcerative colitis ([place]) ?  Diabetes mellitus type 2 in obese [facility]) ? ?Sepsis secondary to e coli UTI/bacteremia ?-Sepsis present on admission: Fever, tachycardia, tachypnea, and elevated lactic acid at 2.8 ?-Sepsis is related to UTI, as well E. coli bacteremia ?-Continue with IV antibiotics.  Continue with IV Rocephin, urine culture showing sensitivity to Rocephin, but MIC of 16 to cefazolin, cussed with pharmacy, can not narrowed Rocephin, and no available oral option, so he need to finish IV Rocephin treatment during hospital stay, will need at least 5 to 7 days. ?-Continue with IV fluids ?-Continue to trend procalcitonin ?-Remains febrile over last 24 hours. ? ?Right upper extremity edema ?-IV appears to be infiltrated, discussed with staff to remove IV to the left arm, elevate arm and apply warm compress . ?-Will check venous Doppler ? ? ?Diabetes mellitus type 2 in obese [facility]) ?-Prior A1c was 6.1 ?-He does not appear to be taking medications for this issue ?-Cover with moderate-scale SSI ?  ?Ulcerative colitis ([place]) ?-Continue mesalamine ?  ?Dyslipidemia ?-Continue Lipitor ?  ?  ?  ? ? ?DVT prophylaxis: Lovenox ?Code Status: Full ?Family Communication: D/W grandson at bedside ?Disposition:  ? ?Status is: Inpatient ?Remains inpatient appropriate because: bacteremia with IV AABX ?  ?Consultants:  ?None ? ? ?Subjective: ? ?Reports dysuria,  fever, chills, complains of right arm pain. ? ?Objective: ?Vitals:  ? 11/12/21 2316 11/13/21 0000 11/13/21 0317 11/13/21 0717  ?BP: (!) 155/119 (!) 109/58 118/76 (!) 154/86  ?Pulse: 92 79 79 (!) 101  ?Resp: '18 18 18 16  '$ ?Temp: 99.1 ?F (37.3 ?C) 98.9 ?F (37.2 ?C) 97.7 ?F (36.5 ?C) 99.1 ?F (37.3 ?C)  ?TempSrc: Oral Oral Oral Oral  ?SpO2: 92% 90% 96% 93%  ?Weight:      ?Height:      ? ? ?Intake/Output Summary (Last 24 hours) at 11/13/2021 0937 ?Last data filed at 11/13/2021 1572 ?Gross per 24 hour  ?Intake 315.58 ml  ?Output 800 ml  ?Net -484.42 ml  ? ?Filed Weights  ? 11/11/21 0434  ?Weight: 90.9 kg  ? ? ?Examination: ? ?Awake Alert, Oriented X 3, frail, ill-appearing ?Symmetrical Chest wall movement, Good air movement bilaterally, CTAB ?RRR,No Gallops,Rubs or new Murmurs, No Parasternal Heave ?+ve B.Sounds, Abd Soft, No tenderness, No rebound - guarding or rigidity. ?No Cyanosis, he has significant right upper extremity edema ? ? ? ? ?Data Reviewed: I have personally reviewed following labs and imaging studies ? ?CBC: ?Recent Labs  ?Lab 11/11/21 ?0453 11/12/21 ?0131  ?WBC 9.0 11.8*  ?NEUTROABS 7.1  --   ?HGB 15.3 12.6*  ?HCT 45.6 37.6*  ?MCV 89.6 89.1  ?PLT 125* 100*  ? ? ?Basic Metabolic Panel: ?Recent Labs  ?Lab 11/11/21 ?0453 11/12/21 ?0131  ?NA 136 135  ?K 3.1* 3.4*  ?CL 104 105  ?CO2 20* 22  ?GLUCOSE 178* 159*  ?BUN  17 15  ?CREATININE 1.27* 1.04  ?CALCIUM 8.5* 7.8*  ? ? ?GFR: ?Estimated Creatinine Clearance: 63.4 mL/min (by C-G formula based on SCr of 1.04 mg/dL). ? ?Liver Function Tests: ?Recent Labs  ?Lab 11/11/21 ?6269  ?AST 24  ?ALT 25  ?ALKPHOS 100  ?BILITOT 1.7*  ?PROT 7.2  ?ALBUMIN 3.3*  ? ? ?CBG: ?Recent Labs  ?Lab 11/12/21 ?1219 11/12/21 ?1458 11/12/21 ?1601 11/12/21 ?2029 11/13/21 ?0737  ?GLUCAP 136* 102* 100* 121* 119*  ? ? ? ?Recent Results (from the past 240 hour(s))  ?Culture, blood (Routine x 2)     Status: Abnormal  ? Collection Time: 11/11/21  4:53 AM  ? Specimen: BLOOD RIGHT ARM  ?Result Value  Ref Range Status  ? Specimen Description BLOOD RIGHT ARM  Final  ? Special Requests   Final  ?  BOTTLES DRAWN AEROBIC AND ANAEROBIC Blood Culture adequate volume  ? Culture  Setup Time   Final  ?  GRAM NEGATIVE RODS ?IN BOTH AEROBIC AND ANAEROBIC BOTTLES ?CRITICAL RESULT CALLED TO, READ BACK BY AND VERIFIED WITH: ?PHARMD LISA POWELL 11/11/21'@21'$ :30 BY TW ?Performed at [facility], [place] [address], [place], [place] [address] ?  ? Culture ESCHERICHIA COLI (A)  Final  ? Report Status 11/13/2021 FINAL  Final  ? Organism ID, Bacteria ESCHERICHIA COLI  Final  ?    Susceptibility  ? Escherichia coli - MIC*  ?  AMPICILLIN >=32 RESISTANT Resistant   ?  CEFAZOLIN 8 SENSITIVE Sensitive   ?  CEFEPIME <=0.12 SENSITIVE Sensitive   ?  CEFTAZIDIME <=1 SENSITIVE Sensitive   ?  CEFTRIAXONE <=0.25 SENSITIVE Sensitive   ?  CIPROFLOXACIN 0.5 INTERMEDIATE Intermediate   ?  GENTAMICIN >=16 RESISTANT Resistant   ?  IMIPENEM <=0.25 SENSITIVE Sensitive   ?  TRIMETH/SULFA >=320 RESISTANT Resistant   ?  AMPICILLIN/SULBACTAM >=32 RESISTANT Resistant   ?  PIP/TAZO 8 SENSITIVE Sensitive   ?  * ESCHERICHIA COLI  ?Culture, blood (Routine x 2)     Status: None (Preliminary result)  ? Collection Time: 11/11/21  4:53 AM  ? Specimen: BLOOD RIGHT HAND  ?Result Value Ref Range Status  ? Specimen Description BLOOD RIGHT HAND  Final  ? Special Requests AEROBIC BOTTLE ONLY Blood Culture adequate volume  Final  ? Culture   Final  ?  NO GROWTH 2 DAYS ?Performed at [facility], [place] [address] [place], [place] [address] ?  ? Report Status PENDING  Incomplete  ?Urine Culture     Status: Abnormal  ? Collection Time: 11/11/21  4:53 AM  ? Specimen: In/Out Cath Urine  ?Result Value Ref Range Status  ? Specimen Description IN/OUT CATH URINE  Final  ? Special Requests   Final  ?  NONE ?Performed at [facility], [place] [address] [place], [place] [address] ?  ? Culture >=100,000 COLONIES/mL ESCHERICHIA COLI (A)  Final  ? Report Status 11/13/2021 FINAL   Final  ? Organism ID, Bacteria ESCHERICHIA COLI (A)  Final  ?    Susceptibility  ? Escherichia coli - MIC*  ?  AMPICILLIN >=32 RESISTANT Resistant   ?  CEFAZOLIN 16 SENSITIVE Sensitive   ?  CEFEPIME <=0.12 SENSITIVE Sensitive   ?  CEFTRIAXONE <=0.25 SENSITIVE Sensitive   ?  CIPROFLOXACIN 0.5 INTERMEDIATE Intermediate   ?  GENTAMICIN >=16 RESISTANT Resistant   ?  IMIPENEM <=0.25 SENSITIVE Sensitive   ?  NITROFURANTOIN <=16 SENSITIVE Sensitive   ?  TRIMETH/SULFA >=320 RESISTANT Resistant   ?  AMPICILLIN/SULBACTAM >=32 RESISTANT Resistant   ?  PIP/TAZO 64 INTERMEDIATE Intermediate   ?  * >=100,000 COLONIES/mL ESCHERICHIA COLI  ?Blood Culture ID Panel (Reflexed)     Status: Abnormal  ? Collection Time: 11/11/21  4:53 AM  ?Result Value Ref Range Status  ? Enterococcus faecalis NOT DETECTED NOT DETECTED Final  ? Enterococcus Faecium NOT DETECTED NOT DETECTED Final  ? Listeria monocytogenes NOT DETECTED NOT DETECTED Final  ? Staphylococcus species NOT DETECTED NOT DETECTED Final  ? Staphylococcus aureus (BCID) NOT DETECTED NOT DETECTED Final  ? Staphylococcus epidermidis NOT DETECTED NOT DETECTED Final  ? Staphylococcus lugdunensis NOT DETECTED NOT DETECTED Final  ? Streptococcus species NOT DETECTED NOT DETECTED Final  ? Streptococcus agalactiae NOT DETECTED NOT DETECTED Final  ? Streptococcus pneumoniae NOT DETECTED NOT DETECTED Final  ? Streptococcus pyogenes NOT DETECTED NOT DETECTED Final  ? A.calcoaceticus-baumannii NOT DETECTED NOT DETECTED Final  ? Bacteroides fragilis NOT DETECTED NOT DETECTED Final  ? Enterobacterales DETECTED (A) NOT DETECTED Final  ?  Comment: Enterobacterales represent a large order of gram negative bacteria, not a single organism. ?CRITICAL RESULT CALLED TO, READ BACK BY AND VERIFIED WITH: ?PHARMD LISA POWELL 11/11/21'@21'$ :30 BY TW ?  ? Enterobacter cloacae complex NOT DETECTED NOT DETECTED Final  ? Escherichia coli DETECTED (A) NOT DETECTED Final  ?  Comment: CRITICAL RESULT CALLED TO, READ  BACK BY AND VERIFIED WITH: ?PHARMD LISA POWELL 11/11/21'@21'$ :30 BY TW ?  ? Klebsiella aerogenes NOT DETECTED NOT DETECTED Final  ? Klebsiella oxytoca NOT DETECTED NOT DETECTED Final  ? Klebsiella pneumoniae

## 2021-11-14 DIAGNOSIS — A419 Sepsis, unspecified organism: Secondary | ICD-10-CM | POA: Diagnosis not present

## 2021-11-14 DIAGNOSIS — N390 Urinary tract infection, site not specified: Secondary | ICD-10-CM | POA: Diagnosis not present

## 2021-11-14 LAB — CBC
HCT: 39.6 % (ref 39.0–52.0)
Hemoglobin: 13.5 g/dL (ref 13.0–17.0)
MCH: 29.8 pg (ref 26.0–34.0)
MCHC: 34.1 g/dL (ref 30.0–36.0)
MCV: 87.4 fL (ref 80.0–100.0)
Platelets: 116 10*3/uL — ABNORMAL LOW (ref 150–400)
RBC: 4.53 MIL/uL (ref 4.22–5.81)
RDW: 12.5 % (ref 11.5–15.5)
WBC: 6.6 10*3/uL (ref 4.0–10.5)
nRBC: 0 % (ref 0.0–0.2)

## 2021-11-14 LAB — BASIC METABOLIC PANEL
Anion gap: 10 (ref 5–15)
BUN: 11 mg/dL (ref 8–23)
CO2: 21 mmol/L — ABNORMAL LOW (ref 22–32)
Calcium: 8 mg/dL — ABNORMAL LOW (ref 8.9–10.3)
Chloride: 100 mmol/L (ref 98–111)
Creatinine, Ser: 1.05 mg/dL (ref 0.61–1.24)
GFR, Estimated: >60 mL/min (ref >60)
Glucose, Bld: 128 mg/dL — ABNORMAL HIGH (ref 70–99)
Potassium: 3.3 mmol/L — ABNORMAL LOW (ref 3.5–5.1)
Sodium: 131 mmol/L — ABNORMAL LOW (ref 135–145)

## 2021-11-14 LAB — GLUCOSE, CAPILLARY
Glucose-Capillary: 109 mg/dL — ABNORMAL HIGH (ref 70–99)
Glucose-Capillary: 138 mg/dL — ABNORMAL HIGH (ref 70–99)
Glucose-Capillary: 130 mg/dL — ABNORMAL HIGH (ref 70–99)

## 2021-11-14 MED ORDER — POTASSIUM CHLORIDE CRYS ER 20 MEQ PO TBCR
20.0000 meq | EXTENDED_RELEASE_TABLET | Freq: Two times a day (BID) | ORAL | Status: AC
Start: 2021-11-14 — End: 2021-11-15
  Administered 2021-11-14 – 2021-11-15 (×4): 20 meq via ORAL
  Filled 2021-11-14 (×4): qty 1

## 2021-11-14 MED ORDER — TAMSULOSIN HCL 0.4 MG PO CAPS
0.4000 mg | ORAL_CAPSULE | Freq: Every day | ORAL | Status: DC
  Administered 2021-11-14 – 2021-11-16 (×3): 0.4 mg via ORAL
  Filled 2021-11-14 (×4): qty 1

## 2021-11-14 NOTE — Progress Notes (Signed)
?PROGRESS NOTE ? ? ? ?Jack [name]  PXT:[id number] DOB: 05-30-1946 DOA: 11/11/2021 ?PCP: Lawerance Cruel, MD  ? ? ?Brief Narrative:  ?76 year old gentleman with history of type 2 diabetes, hyperlipidemia, inflammatory bowel disease on mesalamine, prostatomegaly presented with shortness of breath, fever dysuria and pyuria and found to have acute UTI and admitted to the hospital. ? ? ?Assessment & Plan: ?  ?Sepsis secondary to E. coli UTI, E. coli bacteremia: ?Presented with fever, tachycardia, tachypnea and lactic acid 2.8. ?Urine culture with E. coli sensitive to Rocephin, blood cultures with E. coli sensitive to Rocephin. ?Temperature 102 overnight.  Some clinical improvement today. ?Continue Rocephin 2 g IV daily.  Repeat blood cultures today due to persistent fever to look for clearance of bacteremia. ?Presentation CT scan was essentially normal, no evidence of hydronephrosis or pyelonephrosis. ?Significant prostatism symptoms, will start patient on Flomax.  Check postvoid residual urine. ?Patient is followed by urology for elevated PSA, plans to follow-up every year. ? ?Infiltrated IV right upper extremity with edema: IV line removed.  Duplex studies negative for DVT.  Apply cold compress. ? ?Ulcerative colitis on mesalamine.  Continue. ? ?Dyslipidemia: On Lipitor.  Continue. ? ?Type 2 diabetes, diet controlled: Known A1c 6.1.  No indication for treatment. ? ? ? ? ?DVT prophylaxis: enoxaparin (LOVENOX) injection 40 mg Start: 11/11/21 1023 ? ? ?Code Status: Full code ?Family Communication: Son-in-law at the bedside, daughter on the phone. ?Disposition Plan: Status is: Inpatient ?Remains inpatient appropriate because: Persistent fever on IV antibiotics ?  ? ? ?Consultants:  ?None ? ?Procedures:  ?None ? ?Antimicrobials:  ?Rocephin 3/26--- ? ? ?Subjective: ?Patient seen and examined.  Eating breakfast.  Son-in-law at the bedside.  Daughter on the phone helps with translation.  Patient is still has frequency  of urination but denies any suprapubic pain nausea vomiting or discomfort.  Tmax 102 last 24 hours. ? ?Objective: ?Vitals:  ? 11/14/21 0339 11/14/21 0742 11/14/21 1059 11/14/21 1312  ?BP: 134/82 138/86 131/87   ?Pulse: 92 75 77   ?Resp: '18 17 17   '$ ?Temp: 98.2 ?F (36.8 ?C) 98 ?F (36.7 ?C) 98.2 ?F (36.8 ?C)   ?TempSrc: Oral Oral Oral   ?SpO2: 97% 95%  95%  ?Weight:      ?Height:      ? ? ?Intake/Output Summary (Last 24 hours) at 11/14/2021 1323 ?Last data filed at 11/14/2021 0900 ?Gross per 24 hour  ?Intake 120 ml  ?Output 350 ml  ?Net -230 ml  ? ?Filed Weights  ? 11/11/21 0434  ?Weight: 90.9 kg  ? ? ?Examination: ? ?General exam: Appears calm and comfortable.  On room air. ?Respiratory system: Clear to auscultation. Respiratory effort normal.  No added sounds. ?Cardiovascular system: S1 & S2 heard, RRR.  No edema. ?Gastrointestinal system: Soft.  Nontender.  Bowel sound present. ?Central nervous system: Alert and oriented. No focal neurological deficits. ?Extremities: Symmetric 5 x 5 power. ?Skin: No rashes, lesions or ulcers ?Psychiatry: Judgement and insight appear normal. Mood & affect appropriate.  ? ? ? ?Data Reviewed: I have personally reviewed following labs and imaging studies ? ?CBC: ?Recent Labs  ?Lab 11/11/21 ?0453 11/12/21 ?0131 11/14/21 ?0153  ?WBC 9.0 11.8* 6.6  ?NEUTROABS 7.1  --   --   ?HGB 15.3 12.6* 13.5  ?HCT 45.6 37.6* 39.6  ?MCV 89.6 89.1 87.4  ?PLT 125* 100* 116*  ? ?Basic Metabolic Panel: ?Recent Labs  ?Lab 11/11/21 ?0453 11/12/21 ?0131 11/14/21 ?0153  ?NA 136 135 131*  ?K 3.1*  3.4* 3.3*  ?CL 104 105 100  ?CO2 20* 22 21*  ?GLUCOSE 178* 159* 128*  ?BUN '17 15 11  '$ ?CREATININE 1.27* 1.04 1.05  ?CALCIUM 8.5* 7.8* 8.0*  ? ?GFR: ?Estimated Creatinine Clearance: 62.8 mL/min (by C-G formula based on SCr of 1.05 mg/dL). ?Liver Function Tests: ?Recent Labs  ?Lab 11/11/21 ?8841  ?AST 24  ?ALT 25  ?ALKPHOS 100  ?BILITOT 1.7*  ?PROT 7.2  ?ALBUMIN 3.3*  ? ?No results for input(s): LIPASE, AMYLASE in the last  168 hours. ?No results for input(s): AMMONIA in the last 168 hours. ?Coagulation Profile: ?Recent Labs  ?Lab 11/11/21 ?6606  ?INR 1.0  ? ?Cardiac Enzymes: ?No results for input(s): CKTOTAL, CKMB, CKMBINDEX, TROPONINI in the last 168 hours. ?BNP (last 3 results) ?No results for input(s): PROBNP in the last 8760 hours. ?HbA1C: ?No results for input(s): HGBA1C in the last 72 hours. ?CBG: ?Recent Labs  ?Lab 11/13/21 ?1228 11/13/21 ?1623 11/13/21 ?2035 11/14/21 ?0859 11/14/21 ?1258  ?GLUCAP 106* 127* 157* 109* 138*  ? ?Lipid Profile: ?No results for input(s): CHOL, HDL, LDLCALC, TRIG, CHOLHDL, LDLDIRECT in the last 72 hours. ?Thyroid Function Tests: ?No results for input(s): TSH, T4TOTAL, FREET4, T3FREE, THYROIDAB in the last 72 hours. ?Anemia Panel: ?No results for input(s): VITAMINB12, FOLATE, FERRITIN, TIBC, IRON, RETICCTPCT in the last 72 hours. ?Sepsis Labs: ?Recent Labs  ?Lab 11/11/21 ?3016 11/11/21 ?0750  ?PROCALCITON 1.01  --   ?LATICACIDVEN 2.8* 1.9  ? ? ?Recent Results (from the past 240 hour(s))  ?Culture, blood (Routine x 2)     Status: Abnormal  ? Collection Time: 11/11/21  4:53 AM  ? Specimen: BLOOD RIGHT ARM  ?Result Value Ref Range Status  ? Specimen Description BLOOD RIGHT ARM  Final  ? Special Requests   Final  ?  BOTTLES DRAWN AEROBIC AND ANAEROBIC Blood Culture adequate volume  ? Culture  Setup Time   Final  ?  GRAM NEGATIVE RODS ?IN BOTH AEROBIC AND ANAEROBIC BOTTLES ?CRITICAL RESULT CALLED TO, READ BACK BY AND VERIFIED WITH: ?PHARMD LISA POWELL 11/11/21'@21'$ :30 BY TW ?Performed at [facility], [place] [address] [place], [place] [address] ?  ? Culture ESCHERICHIA COLI (A)  Final  ? Report Status 11/13/2021 FINAL  Final  ? Organism ID, Bacteria ESCHERICHIA COLI  Final  ?    Susceptibility  ? Escherichia coli - MIC*  ?  AMPICILLIN >=32 RESISTANT Resistant   ?  CEFAZOLIN 8 SENSITIVE Sensitive   ?  CEFEPIME <=0.12 SENSITIVE Sensitive   ?  CEFTAZIDIME <=1 SENSITIVE Sensitive   ?  CEFTRIAXONE <=0.25  SENSITIVE Sensitive   ?  CIPROFLOXACIN 0.5 INTERMEDIATE Intermediate   ?  GENTAMICIN >=16 RESISTANT Resistant   ?  IMIPENEM <=0.25 SENSITIVE Sensitive   ?  TRIMETH/SULFA >=320 RESISTANT Resistant   ?  AMPICILLIN/SULBACTAM >=32 RESISTANT Resistant   ?  PIP/TAZO 8 SENSITIVE Sensitive   ?  * ESCHERICHIA COLI  ?Culture, blood (Routine x 2)     Status: None (Preliminary result)  ? Collection Time: 11/11/21  4:53 AM  ? Specimen: BLOOD RIGHT HAND  ?Result Value Ref Range Status  ? Specimen Description BLOOD RIGHT HAND  Final  ? Special Requests AEROBIC BOTTLE ONLY Blood Culture adequate volume  Final  ? Culture   Final  ?  NO GROWTH 3 DAYS ?Performed at [facility], [place] [address] [place], [place] [address] ?  ? Report Status PENDING  Incomplete  ?Urine Culture     Status: Abnormal  ?  Collection Time: 11/11/21  4:53 AM  ? Specimen: In/Out Cath Urine  ?Result Value Ref Range Status  ? Specimen Description IN/OUT CATH URINE  Final  ? Special Requests   Final  ?  NONE ?Performed at [facility], [place] [address], [place], [place] [address] ?  ? Culture >=100,000 COLONIES/mL ESCHERICHIA COLI (A)  Final  ? Report Status 11/13/2021 FINAL  Final  ? Organism ID, Bacteria ESCHERICHIA COLI (A)  Final  ?    Susceptibility  ? Escherichia coli - MIC*  ?  AMPICILLIN >=32 RESISTANT Resistant   ?  CEFAZOLIN 16 SENSITIVE Sensitive   ?  CEFEPIME <=0.12 SENSITIVE Sensitive   ?  CEFTRIAXONE <=0.25 SENSITIVE Sensitive   ?  CIPROFLOXACIN 0.5 INTERMEDIATE Intermediate   ?  GENTAMICIN >=16 RESISTANT Resistant   ?  IMIPENEM <=0.25 SENSITIVE Sensitive   ?  NITROFURANTOIN <=16 SENSITIVE Sensitive   ?  TRIMETH/SULFA >=320 RESISTANT Resistant   ?  AMPICILLIN/SULBACTAM >=32 RESISTANT Resistant   ?  PIP/TAZO 64 INTERMEDIATE Intermediate   ?  * >=100,000 COLONIES/mL ESCHERICHIA COLI  ?Blood Culture ID Panel (Reflexed)     Status: Abnormal  ? Collection Time: 11/11/21  4:53 AM  ?Result Value Ref Range Status  ? Enterococcus faecalis NOT  DETECTED NOT DETECTED Final  ? Enterococcus Faecium NOT DETECTED NOT DETECTED Final  ? Listeria monocytogenes NOT DETECTED NOT DETECTED Final  ? Staphylococcus species NOT DETECTED NOT DETECTED Final  ? Staphyloc

## 2021-11-15 LAB — GLUCOSE, CAPILLARY: Glucose-Capillary: 145 mg/dL — ABNORMAL HIGH (ref 70–99)

## 2021-11-15 MED ORDER — SODIUM CHLORIDE 0.9 % IV SOLN
2.0000 g | INTRAVENOUS | Status: DC
  Administered 2021-11-16: 2 g via INTRAVENOUS
  Filled 2021-11-15: qty 20

## 2021-11-15 NOTE — TOC Initial Note (Addendum)
Transition of Care (TOC) - Initial/Assessment Note  ? ? ?Patient Details  ?Name: Jack [name] ?MRN: [id number] ?Date of Birth: 1946/05/11 ? ?Transition of Care (TOC) CM/SW Contact:    ?Jack Bender, RN ?Phone Number: ?11/15/2021, 12:23 PM ? ?Clinical Narrative:                 ? ?Spoke to daughter, Jack [name], who states she lives with her parents and children.  ?She takes patient to his appointments when he is unable to drive ? ?Expected Discharge Plan: Home/Self Care ?Barriers to Discharge: Continued Medical Work up ? ? ?Patient Goals and CMS Choice ?  ?  ?  ? ?Expected Discharge Plan and Services ?Expected Discharge Plan: Home/Self Care ?  ?  ?  ?Living arrangements for the past 2 months: [place] ?                ?  ?  ?  ?  ?  ?  ?  ?  ?  ?  ? ?Prior Living Arrangements/Services ?Living arrangements for the past 2 months: [place] ?Lives with:: Adult Children, Spouse, Minor Children ?  ?       ?Need for Family Participation in Patient Care: Yes (Comment) ?Care giver support system in place?: Yes (comment) ?  ?Criminal Activity/Legal Involvement Pertinent to Current Situation/Hospitalization: No - Comment as needed ? ?Activities of Daily Living ?Home Assistive Devices/Equipment: None ?ADL Screening (condition at time of admission) ?Patient's cognitive ability adequate to safely complete daily activities?: Yes ?Is the patient deaf or have difficulty hearing?: Yes ?Does the patient have difficulty seeing, even when wearing glasses/contacts?: No ?Does the patient have difficulty concentrating, remembering, or making decisions?: Yes ?Patient able to express need for assistance with ADLs?: Yes ?Does the patient have difficulty dressing or bathing?: No ?Independently performs ADLs?: Yes (appropriate for developmental age) ?Does the patient have difficulty walking or climbing stairs?: No ?Weakness of Legs: None ?Weakness of Arms/Hands: None ? ?Permission Sought/Granted ?  ?  ?   ?   ?   ?    ? ?Emotional Assessment ?  ?  ?  ?  ?  ?  ? ?Admission diagnosis:  Sepsis due to undetermined organism [facility]) [A41.9] ?Urinary tract infection without hematuria, site unspecified [N39.0] ?Sepsis, due to unspecified organism, unspecified whether acute organ dysfunction present ([place]) [A41.9] ?Patient Active Problem List  ? Diagnosis Date Noted  ? Sepsis secondary to UTI ([place]) 11/11/2021  ? Diabetes mellitus type 2 in obese ([place]) 11/11/2021  ? 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 Dyslipidemia 02/26/2010  ? GLUCOSE INTOLERANCE 10/26/2009  ? HYPERGLYCEMIA 10/25/2009  ? SLEEP APNEA 09/18/2009  ? HEMORRHOIDS, EXTERNAL 07/27/2009  ? GERD 06/14/2009  ? Ulcerative colitis ([place]) 06/14/2009  ? DIVERTICULOSIS, COLON 06/14/2009  ? FATTY LIVER DISEASE 06/14/2009  ? OSTEOARTHRITIS 06/14/2009  ? DYSPNEA 06/14/2009  ? ?PCP:  Jack Cruel, MD ?Pharmacy:   ?[facility] [address] ?[place] [address] ?Phone: [phone] Fax: [phone] ? ? ? ? ?Social Determinants of Health (SDOH) Interventions ?  ? ?Readmission Risk Interventions ?   ? View : No data to display.  ?  ?  ?  ? ? ? ?

## 2021-11-15 NOTE — Progress Notes (Signed)
?PROGRESS NOTE ? ? ? ?Jack [name]  XBW:[id number] DOB: 10-15-1945 DOA: 11/11/2021 ?PCP: Lawerance Cruel, MD  ? ? ?Brief Narrative:  ?76 year old gentleman with history of type 2 diabetes, hyperlipidemia, inflammatory bowel disease on mesalamine, prostatomegaly presented with shortness of breath, fever dysuria and pyuria and found to have acute UTI and admitted to the hospital. ? ? ?Assessment & Plan: ?  ?Sepsis secondary to E. coli UTI, E. coli bacteremia: ?Presented with fever, tachycardia, tachypnea and lactic acid 2.8. ?Urine culture with E. coli sensitive to Rocephin, blood cultures with E. coli sensitive to Rocephin. ?Temperature 99 overnight.  Some clinical improvement today. ?Continue Rocephin 2 g IV daily.  Repeat blood cultures 3/29 due to persistent fever to look for clearance of bacteremia is negative so far. ?Presentation CT scan was essentially normal, no evidence of hydronephrosis or pyelonephrosis. ?No significant postvoid residual.  Started on Flomax. ? ?Infiltrated IV right upper extremity with edema: IV line removed.  Duplex studies negative for DVT.  Apply cold compress. ? ?Ulcerative colitis on mesalamine.  Continue. ? ?Dyslipidemia: On Lipitor.  Continue. ? ?Type 2 diabetes, diet controlled: Known A1c 6.1.  No indication for treatment. ? ? ?Transfer to MedSurg bed.  Mobilize in the hallway.  We will continue IV antibiotics today because of no appropriate oral antibiotic choices. ? ?DVT prophylaxis: enoxaparin (LOVENOX) injection 40 mg Start: 11/11/21 1023 ? ? ?Code Status: Full code ?Family Communication: daughter on the phone. ?Disposition Plan: Status is: Inpatient ?Remains inpatient appropriate because: Persistent fever on IV antibiotics. ?  ?Consultants:  ?None ? ?Procedures:  ?None ? ?Antimicrobials:  ?Rocephin 3/26--- ? ? ?Subjective: ? ?Patient seen and examined.  Overnight with low-grade temperature he just felt bad but no other events.  Tmax 99. ?Postvoid residual less than 100  mL.  Currently afebrile.  Denies any nausea vomiting or abdominal pain. ? ? ?Objective: ?Vitals:  ? 11/15/21 0000 11/15/21 0325 11/15/21 0400 11/15/21 0750  ?BP: (!) 149/83 (!) 156/95 (!) 130/91 140/79  ?Pulse: 81 73 76 84  ?Resp: '16 17  16  '$ ?Temp: 98.1 ?F (36.7 ?C) 98.4 ?F (36.9 ?C)  98 ?F (36.7 ?C)  ?TempSrc: Oral Oral  Oral  ?SpO2: 95% 92% 95% 94%  ?Weight:      ?Height:      ? ? ?Intake/Output Summary (Last 24 hours) at 11/15/2021 0955 ?Last data filed at 11/15/2021 1638 ?Gross per 24 hour  ?Intake 243 ml  ?Output 825 ml  ?Net -582 ml  ? ?Filed Weights  ? 11/11/21 0434  ?Weight: 90.9 kg  ? ? ?Examination: ? ?General: Looks fairly comfortable.  On room air. ?Cardiovascular: S1-S2 normal.  Regular rate rhythm. ?Respiratory: Bilateral clear.  No added sounds. ?Gastrointestinal: Soft.  Nontender.  No suprapubic tenderness. ?Ext: No deformities.  No cyanosis or swelling. ?Neuro: Alert oriented x3.  No deficits. ? ? ? ? ? ?Data Reviewed: I have personally reviewed following labs and imaging studies ? ?CBC: ?Recent Labs  ?Lab 11/11/21 ?0453 11/12/21 ?0131 11/14/21 ?0153  ?WBC 9.0 11.8* 6.6  ?NEUTROABS 7.1  --   --   ?HGB 15.3 12.6* 13.5  ?HCT 45.6 37.6* 39.6  ?MCV 89.6 89.1 87.4  ?PLT 125* 100* 116*  ? ?Basic Metabolic Panel: ?Recent Labs  ?Lab 11/11/21 ?0453 11/12/21 ?0131 11/14/21 ?0153  ?NA 136 135 131*  ?K 3.1* 3.4* 3.3*  ?CL 104 105 100  ?CO2 20* 22 21*  ?GLUCOSE 178* 159* 128*  ?BUN '17 15 11  '$ ?CREATININE 1.27* 1.04  1.05  ?CALCIUM 8.5* 7.8* 8.0*  ? ?GFR: ?Estimated Creatinine Clearance: 62.8 mL/min (by C-G formula based on SCr of 1.05 mg/dL). ?Liver Function Tests: ?Recent Labs  ?Lab 11/11/21 ?3154  ?AST 24  ?ALT 25  ?ALKPHOS 100  ?BILITOT 1.7*  ?PROT 7.2  ?ALBUMIN 3.3*  ? ?No results for input(s): LIPASE, AMYLASE in the last 168 hours. ?No results for input(s): AMMONIA in the last 168 hours. ?Coagulation Profile: ?Recent Labs  ?Lab 11/11/21 ?0086  ?INR 1.0  ? ?Cardiac Enzymes: ?No results for input(s): CKTOTAL,  CKMB, CKMBINDEX, TROPONINI in the last 168 hours. ?BNP (last 3 results) ?No results for input(s): PROBNP in the last 8760 hours. ?HbA1C: ?No results for input(s): HGBA1C in the last 72 hours. ?CBG: ?Recent Labs  ?Lab 11/13/21 ?1623 11/13/21 ?2035 11/14/21 ?0859 11/14/21 ?1258 11/14/21 ?1551  ?GLUCAP 127* 157* 109* 138* 130*  ? ?Lipid Profile: ?No results for input(s): CHOL, HDL, LDLCALC, TRIG, CHOLHDL, LDLDIRECT in the last 72 hours. ?Thyroid Function Tests: ?No results for input(s): TSH, T4TOTAL, FREET4, T3FREE, THYROIDAB in the last 72 hours. ?Anemia Panel: ?No results for input(s): VITAMINB12, FOLATE, FERRITIN, TIBC, IRON, RETICCTPCT in the last 72 hours. ?Sepsis Labs: ?Recent Labs  ?Lab 11/11/21 ?7619 11/11/21 ?0750  ?PROCALCITON 1.01  --   ?LATICACIDVEN 2.8* 1.9  ? ? ?Recent Results (from the past 240 hour(s))  ?Culture, blood (Routine x 2)     Status: Abnormal  ? Collection Time: 11/11/21  4:53 AM  ? Specimen: BLOOD RIGHT ARM  ?Result Value Ref Range Status  ? Specimen Description BLOOD RIGHT ARM  Final  ? Special Requests   Final  ?  BOTTLES DRAWN AEROBIC AND ANAEROBIC Blood Culture adequate volume  ? Culture  Setup Time   Final  ?  GRAM NEGATIVE RODS ?IN BOTH AEROBIC AND ANAEROBIC BOTTLES ?CRITICAL RESULT CALLED TO, READ BACK BY AND VERIFIED WITH: ?PHARMD LISA POWELL 11/11/21'@21'$ :30 BY TW ?Performed at [facility], [place] [address] [place], [place] [address] ?  ? Culture ESCHERICHIA COLI (A)  Final  ? Report Status 11/13/2021 FINAL  Final  ? Organism ID, Bacteria ESCHERICHIA COLI  Final  ?    Susceptibility  ? Escherichia coli - MIC*  ?  AMPICILLIN >=32 RESISTANT Resistant   ?  CEFAZOLIN 8 SENSITIVE Sensitive   ?  CEFEPIME <=0.12 SENSITIVE Sensitive   ?  CEFTAZIDIME <=1 SENSITIVE Sensitive   ?  CEFTRIAXONE <=0.25 SENSITIVE Sensitive   ?  CIPROFLOXACIN 0.5 INTERMEDIATE Intermediate   ?  GENTAMICIN >=16 RESISTANT Resistant   ?  IMIPENEM <=0.25 SENSITIVE Sensitive   ?  TRIMETH/SULFA >=320 RESISTANT  Resistant   ?  AMPICILLIN/SULBACTAM >=32 RESISTANT Resistant   ?  PIP/TAZO 8 SENSITIVE Sensitive   ?  * ESCHERICHIA COLI  ?Culture, blood (Routine x 2)     Status: None (Preliminary result)  ? Collection Time: 11/11/21  4:53 AM  ? Specimen: BLOOD RIGHT HAND  ?Result Value Ref Range Status  ? Specimen Description BLOOD RIGHT HAND  Final  ? Special Requests AEROBIC BOTTLE ONLY Blood Culture adequate volume  Final  ? Culture   Final  ?  NO GROWTH 4 DAYS ?Performed at [facility], [place] [address], [place], [place] [address] ?  ? Report Status PENDING  Incomplete  ?Urine Culture     Status: Abnormal  ? Collection Time: 11/11/21  4:53 AM  ? Specimen: In/Out Cath Urine  ?Result Value Ref Range Status  ? Specimen Description IN/OUT CATH  URINE  Final  ? Special Requests   Final  ?  NONE ?Performed at [facility], [place] [address] [place], [place] [address] ?  ? Culture >=100,000 COLONIES/mL ESCHERICHIA COLI (A)  Final  ? Report Status 11/13/2021 FINAL  Final  ? Organism ID, Bacteria ESCHERICHIA COLI (A)  Final  ?    Susceptibility  ? Escherichia coli - MIC*  ?  AMPICILLIN >=32 RESISTANT Resistant   ?  CEFAZOLIN 16 SENSITIVE Sensitive   ?  CEFEPIME <=0.12 SENSITIVE Sensitive   ?  CEFTRIAXONE <=0.25 SENSITIVE Sensitive   ?  CIPROFLOXACIN 0.5 INTERMEDIATE Intermediate   ?  GENTAMICIN >=16 RESISTANT Resistant   ?  IMIPENEM <=0.25 SENSITIVE Sensitive   ?  NITROFURANTOIN <=16 SENSITIVE Sensitive   ?  TRIMETH/SULFA >=320 RESISTANT Resistant   ?  AMPICILLIN/SULBACTAM >=32 RESISTANT Resistant   ?  PIP/TAZO 64 INTERMEDIATE Intermediate   ?  * >=100,000 COLONIES/mL ESCHERICHIA COLI  ?Blood Culture ID Panel (Reflexed)     Status: Abnormal  ? Collection Time: 11/11/21  4:53 AM  ?Result Value Ref Range Status  ? Enterococcus faecalis NOT DETECTED NOT DETECTED Final  ? Enterococcus Faecium NOT DETECTED NOT DETECTED Final  ? Listeria monocytogenes NOT DETECTED NOT DETECTED Final  ? Staphylococcus species NOT DETECTED NOT  DETECTED Final  ? Staphylococcus aureus (BCID) NOT DETECTED NOT DETECTED Final  ? Staphylococcus epidermidis NOT DETECTED NOT DETECTED Final  ? Staphylococcus lugdunensis NOT DETECTED NOT DETECTED Final  ? Streptococcu

## 2021-11-16 LAB — CULTURE, BLOOD (ROUTINE X 2)
Culture: NO GROWTH
Special Requests: ADEQUATE

## 2021-11-16 LAB — GLUCOSE, CAPILLARY: Glucose-Capillary: 115 mg/dL — ABNORMAL HIGH (ref 70–99)

## 2021-11-16 MED ORDER — TAMSULOSIN HCL 0.4 MG PO CAPS
0.4000 mg | ORAL_CAPSULE | Freq: Every day | ORAL | 2 refills | Status: AC
Start: 2021-11-16 — End: 2022-02-14

## 2021-11-16 MED ORDER — CEFDINIR 300 MG PO CAPS: 300.0000 mg | ORAL_CAPSULE | Freq: Two times a day (BID) | ORAL | 0 refills | Status: AC

## 2021-11-16 NOTE — Discharge Summary (Signed)
Physician Discharge Summary  ?Jack [name] OJJ:[id number] DOB: [birth year] DOA: 11/11/2021 ? ?PCP: Lawerance Cruel, MD ? ?Admit date: 11/11/2021 ?Discharge date: 11/16/2021 ? ?Admitted From: Home ?Disposition: Home ? ?Recommendations for Outpatient Follow-up:  ?Follow up with PCP in 1-2 weeks ?Please obtain BMP/CBC in one week ?Schedule follow-up with urology. ? ?Home Health: N/A ?Equipment/Devices: N/A ? ?Discharge Condition: Stable ?CODE STATUS: Full code ?Diet recommendation: Low-salt and low-carb diet ? ?Discharge summary: ? ?76 year old diet controlled diabetes, hyperlipidemia, and inflammatory bowel disease on mesalamine, benign prostatic hypertrophy followed by urology presented with about 7 days of shortness of breath, dysuria, fever and pyuria.  Admitted to the hospital with UTI and treated with ceftriaxone.  Blood cultures and urine cultures with E. coli. ? ?Sepsis present on admission secondary to E. coli UTI, E. coli bacteremia: ?Presented with fever, tachycardia, tachypnea and lactic acid 2.8. ?Urine culture with E. coli sensitive to Rocephin, blood cultures with E. coli sensitive to Rocephin. ?Remaine afebrile last 48 hours.  Repeat blood cultures negative.  Presentation CT scan was essentially normal, no evidence of hydronephrosis or pyelonephrosis.  No significant residual postvoid. ?Received Rocephin day 6 today.  Will prescribe Omnicef for 7 additional days to treat complicated UTI and bacteremia.  We will start patient on Flomax.  He will need ongoing follow-up with urology, he will schedule follow-up. ? ?Chronic medical issues including ulcerative colitis, hyperlipidemia, type 2 diabetes remained stable.  No changes in medications done. ? ? ?Discharge Diagnoses:  ?Principal Problem: ?  Sepsis secondary to UTI [facility]) ?Active Problems: ?  Dyslipidemia ?  Ulcerative colitis ([place]) ?  Diabetes mellitus type 2 in obese [facility]) ? ? ? ?Discharge Instructions ? ?Discharge Instructions   ? ? Call MD for:   severe uncontrolled pain   Complete by: As directed ?  ? Call MD for:  temperature >100.4   Complete by: As directed ?  ? Diet general   Complete by: As directed ?  ? Increase activity slowly   Complete by: As directed ?  ? ?  ? ?Allergies as of 11/16/2021   ? ?   Reactions  ? Latex   ? Other Other (See Comments)  ? tape  ? ?  ? ?  ?Medication List  ?  ? ?TAKE these medications   ? ?acetaminophen 500 MG tablet ?Commonly known as: TYLENOL ?Take 500 mg by mouth every 6 (six) hours as needed for moderate pain. ?  ?atorvastatin 20 MG tablet ?Commonly known as: LIPITOR ?Take 20 mg daily by mouth. ?  ?carboxymethylcellulose 0.5 % Soln ?Commonly known as: REFRESH PLUS ?1 drop 3 (three) times daily as needed (dry eyes). ?  ?cefdinir 300 MG capsule ?Commonly known as: OMNICEF ?Take 1 capsule (300 mg total) by mouth 2 (two) times daily for 14 doses. ?  ?mesalamine 0.375 g 24 hr capsule ?Commonly known as: APRISO ?Take 1.5 g by mouth every morning. ?  ?pantoprazole 40 MG tablet ?Commonly known as: PROTONIX ?Take 40 mg daily by mouth. ?  ?tamsulosin 0.4 MG Caps capsule ?Commonly known as: FLOMAX ?Take 1 capsule (0.4 mg total) by mouth daily. ?  ? ?  ? ? Follow-up Information   ? ? Lawerance Cruel, MD Follow up in 2 week(s).   ?Specialty: Family Medicine ?Contact information: ?[address] ?[place] [address] ?[phone] ? ? ?  ?  ? ? Ardis Hughs, MD Follow up in 1 month(s).   ?Specialty: Urology ?Contact information: ?[address] ?  [place] [address] ?[phone] ? ? ?  ?  ? ?  ?  ? ?  ? ?Allergies  ?Allergen Reactions  ? Latex   ? Other Other (See Comments)  ?  tape  ? ? ?Consultations: ?None ? ? ?Procedures/Studies: ?DG Chest 2 View ? ?Result Date: 11/11/2021 ?CLINICAL DATA:  Shortness of breath EXAM: CHEST - 2 VIEW COMPARISON:  06/15/2009 FINDINGS: Low volume chest with indistinct density at the bases. Stable generous heart size likely accentuated by fat pad at the apex. Mild aortic tortuosity and  stable hila. IMPRESSION: Indistinct density at the bases which could be atelectasis or infiltrate. Electronically Signed   By: Jorje Guild M.D.   On: 11/11/2021 05:28  ? ?CT CHEST ABDOMEN PELVIS W CONTRAST ? ?Result Date: 11/11/2021 ?CLINICAL DATA:  Dyspnea with unclear at the allergy. EXAM: CT CHEST, ABDOMEN, AND PELVIS WITH CONTRAST TECHNIQUE: Multidetector CT imaging of the chest, abdomen and pelvis was performed following the standard protocol during bolus administration of intravenous contrast. RADIATION DOSE REDUCTION: This exam was performed according to the departmental dose-optimization program which includes automated exposure control, adjustment of the mA and/or kV according to patient size and/or use of iterative reconstruction technique. CONTRAST:  112m OMNIPAQUE IOHEXOL 300 MG/ML  SOLN COMPARISON:  None. FINDINGS: CT CHEST FINDINGS Cardiovascular: Normal heart size. No pericardial effusion. Atheromatous calcification of the aorta and coronaries. Mediastinum/Nodes: Negative for adenopathy or mass. 12 mm left thyroid nodule. No followup recommended (ref: J Am Coll Radiol. 2015 Feb;12(2): 143-50). Lungs/Pleura: There is no edema, consolidation, effusion, or pneumothorax. Mild dependent atelectasis Musculoskeletal: Spondylosis diffusely in the thoracic spine. Subjective generalized osteopenia. No acute finding CT ABDOMEN PELVIS FINDINGS Hepatobiliary: Diffuse hepatic steatosis.No evidence of biliary obstruction or stone. Pancreas: Unremarkable. Spleen: Unremarkable. Adrenals/Urinary Tract: Negative adrenals. No hydronephrosis or stone. Indistinct fat around the urinary bladder which shows prominent wall thickness diffusely. There is history of painful urination per technologist. Stomach/Bowel:  No obstruction. No bowel inflammation Vascular/Lymphatic: No acute vascular abnormality. Atheromatous calcification of the aorta. No mass or adenopathy. Reproductive:Symmetric enlargement of the prostate gland  measuring up to 6 cm in transverse span. Other: No ascites or pneumoperitoneum.  Fatty right inguinal hernia. Musculoskeletal: No acute abnormalities. Generalized lumbar spine degeneration with scoliosis and exaggerated lumbar lordosis. IMPRESSION: 1. Suspected cystitis. 2. Enlarged prostate. 3. Hepatic steatosis. 4. Aortic and coronary atherosclerosis. Electronically Signed   By: JJorje GuildM.D.   On: 11/11/2021 10:32  ? ?VAS [place]PPER EXTREMITY VENOUS DUPLEX ? ?Result Date: 11/13/2021 ?UPPER VENOUS STUDY  Patient Name:  Jack [name] Date of Exam:   11/13/2021 Medical Rec #: [id number]         Accession #:    [id number] of Birth: [birth year]        Patient Gender: M Patient Age:   89years [facility]      VAS [place]PPER EXTREMITY VENOUS DUPLEX Referring Phys: DAWOOD ELGERGAWY --------------------------------------------------------------------------------  Indications: Right upper extremity swelling x3 days Comparison Study: No prior study Performing Technologist: MMaudry MayhewMHA, RDMS, RVT, RDCS  Examination Guidelines: A complete evaluation includes B-mode imaging, spectral Doppler, color Doppler, and power Doppler as needed of all accessible portions of each vessel. Bilateral testing is considered an integral part of a complete examination. Limited examinations for reoccurring indications may be performed as noted.  Right Findings: +----------+------------+---------+-----------+----------+-------+ RIGHT     CompressiblePhasicitySpontaneousPropertiesSummary +----------+------------+---------+-----------+----------+-------+ IJV  Full       Yes       Yes                      +----------+------------+---------+-----------+----------+-------+ Subclavian    Full       Yes       Yes                      +----------+------------+---------+-----------+----------+-------+ Axillary      Full       Yes       Yes                       +----------+------------+---------+-----------+----------+-------+ Brachial      Full       Yes       Yes                      +----------+------------+---------+-----------+----------+-------+ Radial        Full

## 2021-11-19 LAB — CULTURE, BLOOD (ROUTINE X 2)
Culture: NO GROWTH
Culture: NO GROWTH

## 2021-11-21 DIAGNOSIS — R7989 Other specified abnormal findings of blood chemistry: Secondary | ICD-10-CM | POA: Diagnosis not present

## 2021-11-21 DIAGNOSIS — A419 Sepsis, unspecified organism: Secondary | ICD-10-CM | POA: Diagnosis not present

## 2021-11-21 DIAGNOSIS — Z09 Encounter for follow-up examination after completed treatment for conditions other than malignant neoplasm: Secondary | ICD-10-CM | POA: Diagnosis not present

## 2021-11-21 DIAGNOSIS — N390 Urinary tract infection, site not specified: Secondary | ICD-10-CM | POA: Diagnosis not present

## 2021-12-21 DIAGNOSIS — N3020 Other chronic cystitis without hematuria: Secondary | ICD-10-CM | POA: Diagnosis not present

## 2021-12-28 DIAGNOSIS — N3020 Other chronic cystitis without hematuria: Secondary | ICD-10-CM | POA: Diagnosis not present

## 2022-02-14 ENCOUNTER — Ambulatory Visit (HOSPITAL_COMMUNITY)
Admission: EM | Admit: 2022-02-14 | Discharge: 2022-02-14 | Disposition: A | Discharge status: Home / Self Care | Payer: Medicare Other | Attending: Physician Assistant | Admitting: Physician Assistant

## 2022-02-14 ENCOUNTER — Encounter (HOSPITAL_COMMUNITY): Payer: Self-pay

## 2022-02-14 DIAGNOSIS — U071 COVID-19: Secondary | ICD-10-CM

## 2022-02-14 MED ORDER — NIRMATRELVIR/RITONAVIR (PAXLOVID)TABLET: 3.0000 | ORAL_TABLET | Freq: Two times a day (BID) | ORAL | 0 refills | Status: AC
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
## 2022-02-14 NOTE — Discharge Instructions (Addendum)
Return if any problems.

## 2022-02-14 NOTE — ED Triage Notes (Signed)
Per daughter pt c/o fever, cough, scratchy throat, and burning to both eyes since last friday. States took OTC meds with relief. States had a positive home COVID test 2 days ago.

## 2022-04-06 IMAGING — CR DG CHEST 2V
2 series · 2 of 2 positions shown · non-contrast
Comparison: 06/15/2009

CLINICAL DATA: Shortness of breath

EXAM:
CHEST - 2 VIEW

[chest pa]
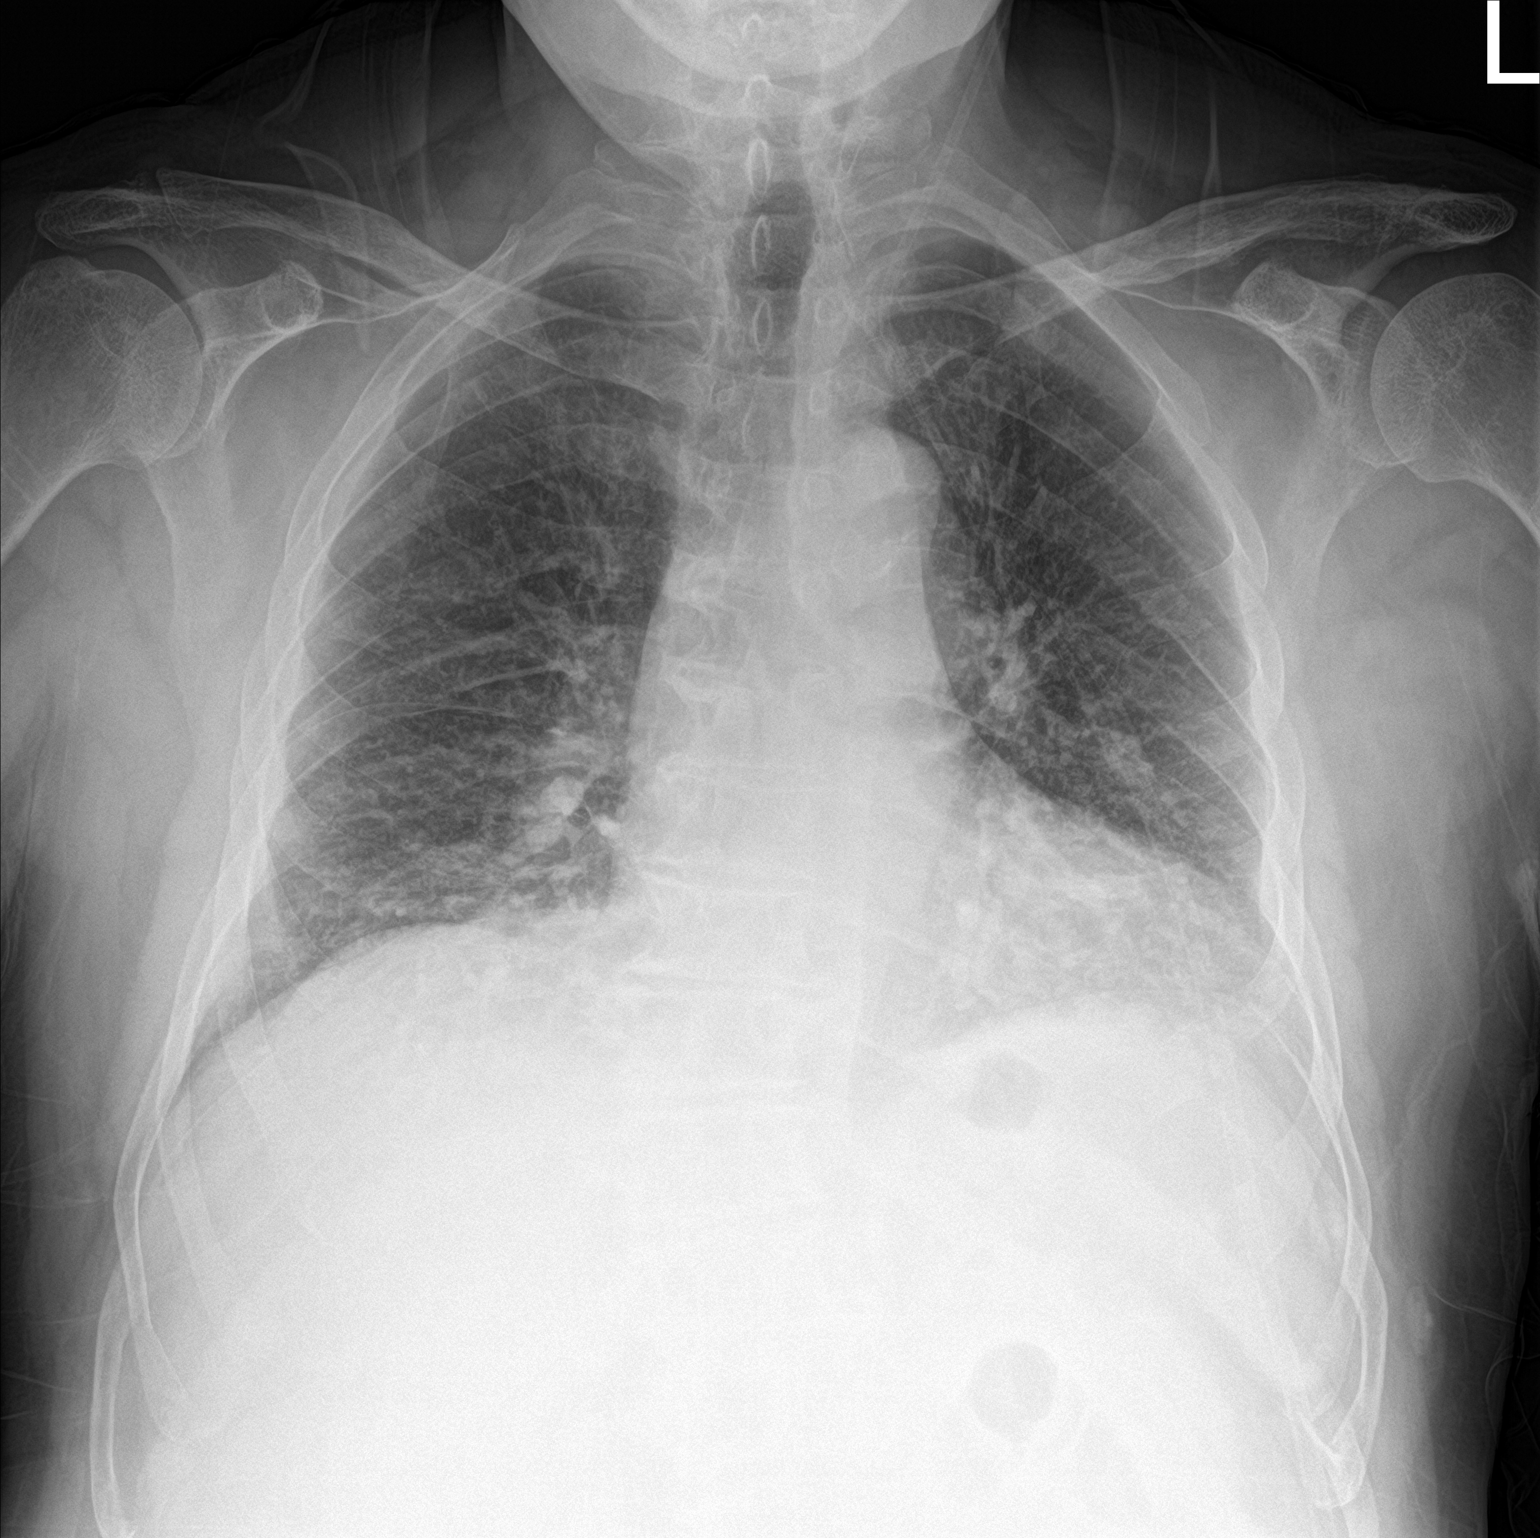

[chest lat]
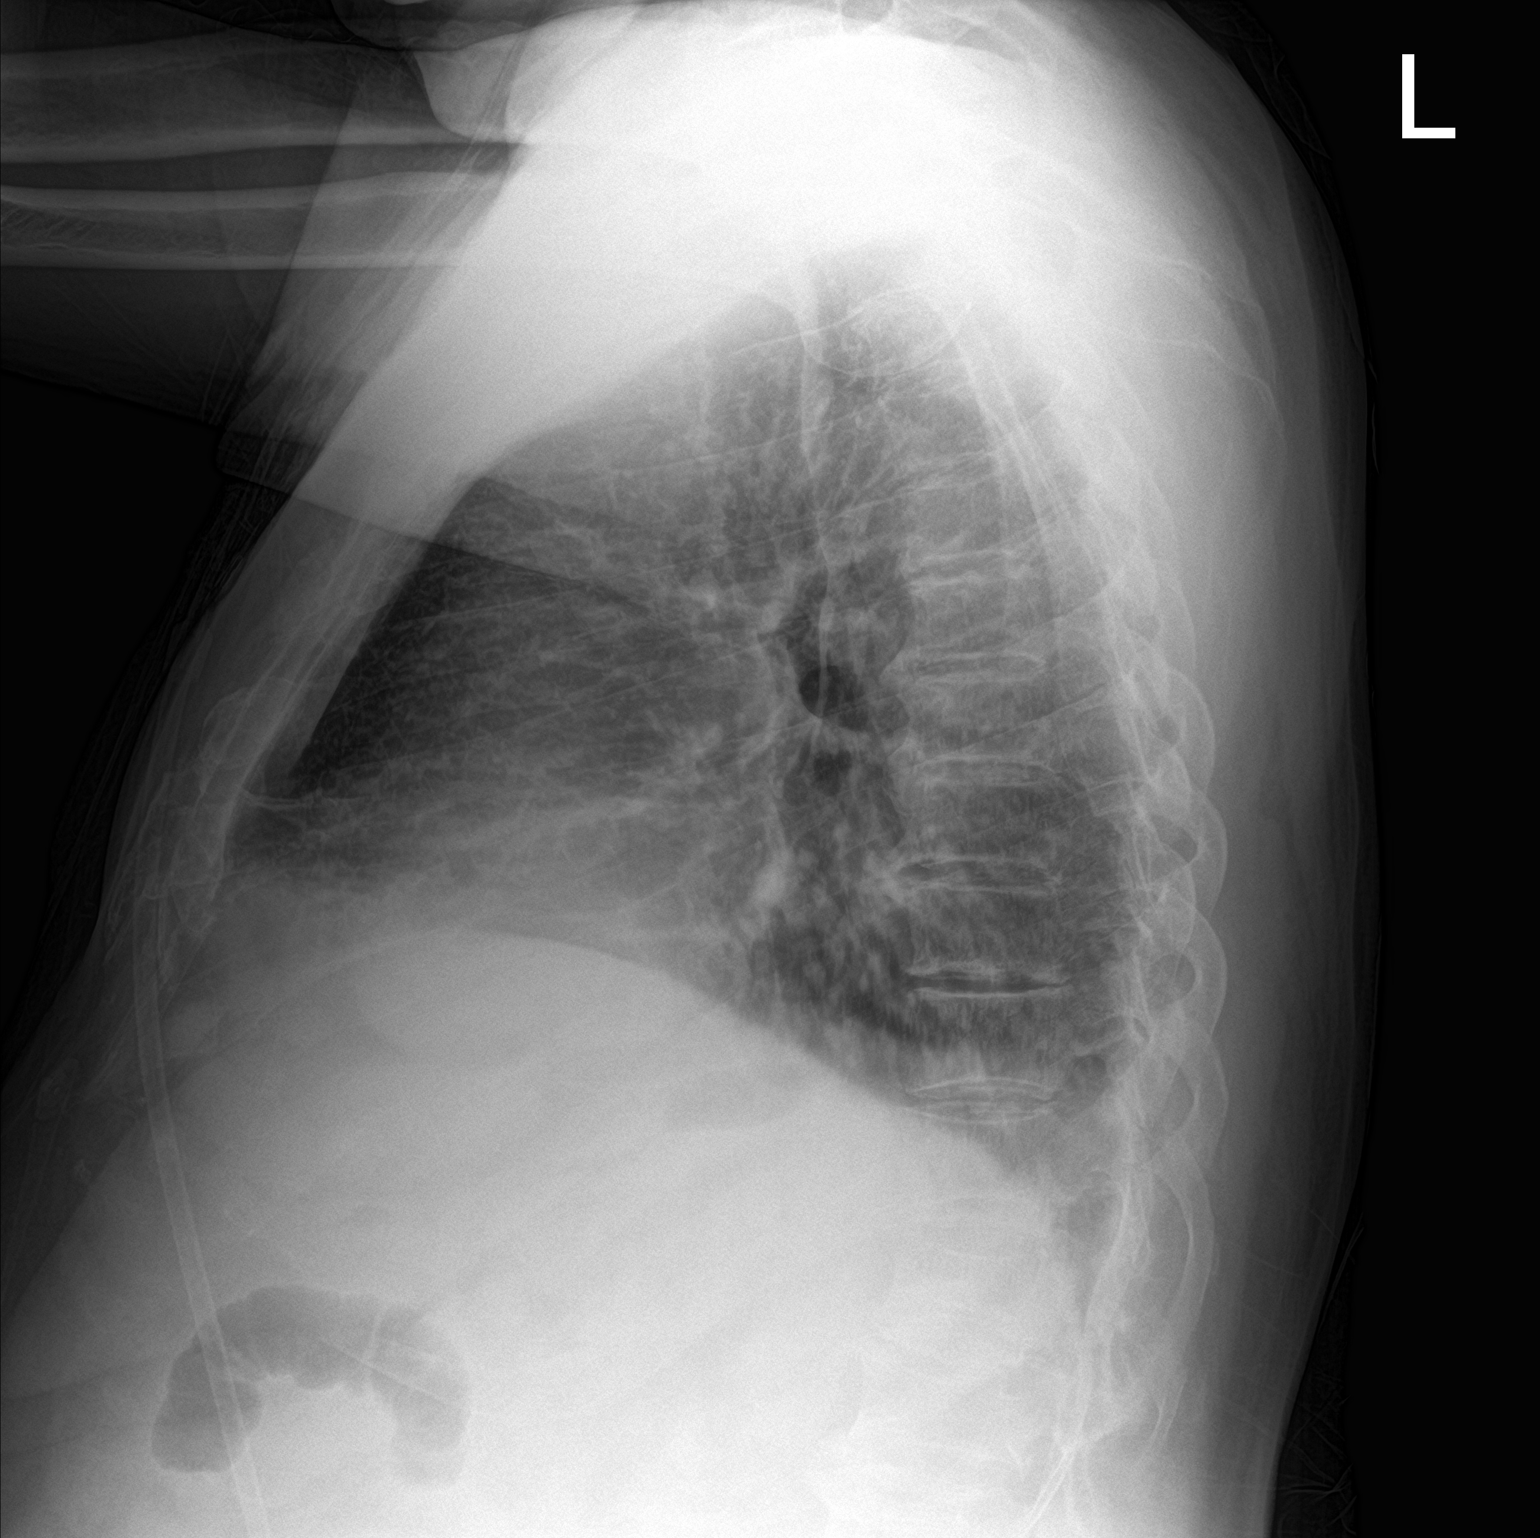

[2 of 2 positions shown; findings below may reference images not displayed]

FINDINGS: Low volume chest with indistinct density at the bases. Stable
generous heart size likely accentuated by fat pad at the apex. Mild
aortic tortuosity and stable hila.
IMPRESSION: Indistinct density at the bases which could be atelectasis or
infiltrate.

## 2022-04-06 IMAGING — CT CT CHEST-ABD-PELV W/ CM
2 of 5 series · 14 of 36 positions shown, 16 images · IV contrast (agent unspecified)
Comparison: None.

CLINICAL DATA: Dyspnea with unclear at the allergy.

EXAM:
CT CHEST, ABDOMEN, AND PELVIS WITH CONTRAST
TECHNIQUE: Multidetector CT imaging of the chest, abdomen and pelvis was
performed following the standard protocol during bolus
administration of intravenous contrast.

[Series 3: cap with 5mm st · axial · 0.95mm/px · z∈[+284,+884]mm · 11 of 146 slices shown, 13 images]
[im 13/146  mediastinal]
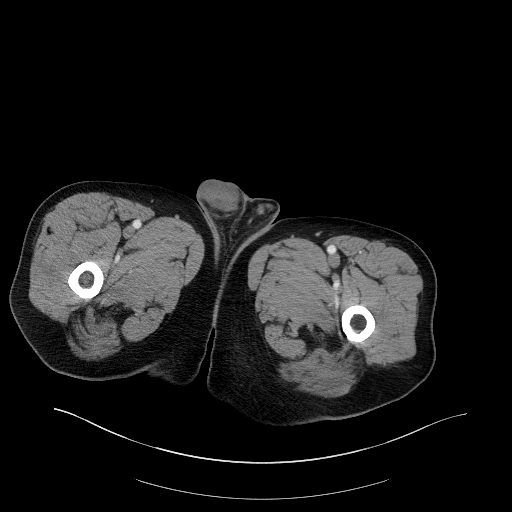
[im 13/146  bone]
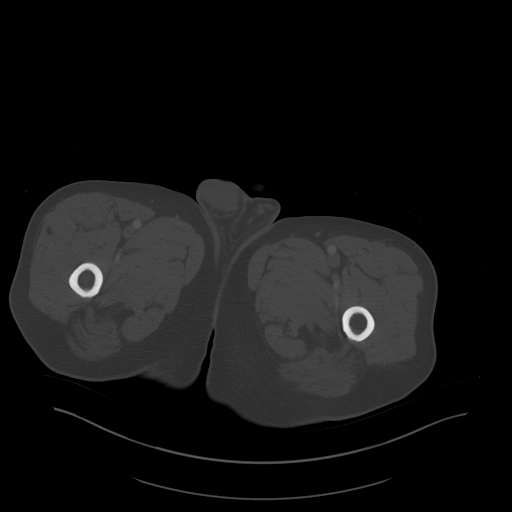
[im 25/146  mediastinal]
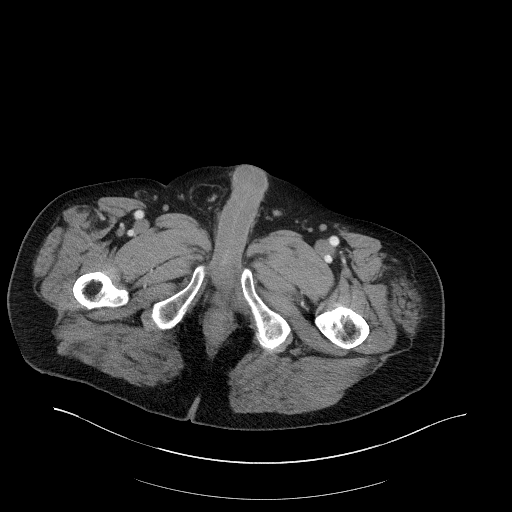
[im 37/146  mediastinal]
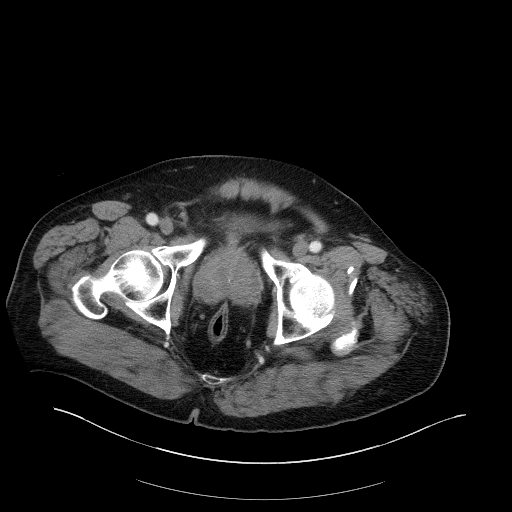
[im 49/146  mediastinal]
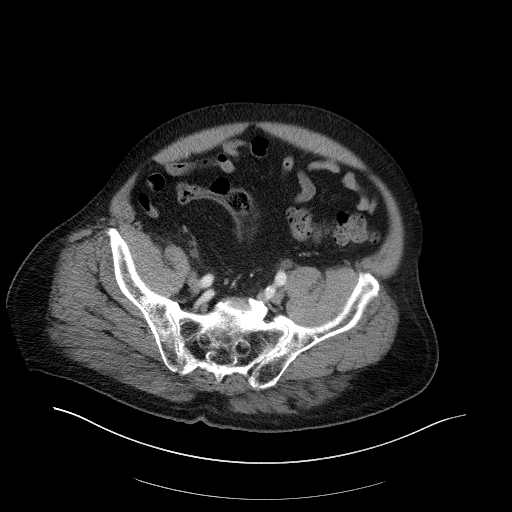
[im 61/146  mediastinal]
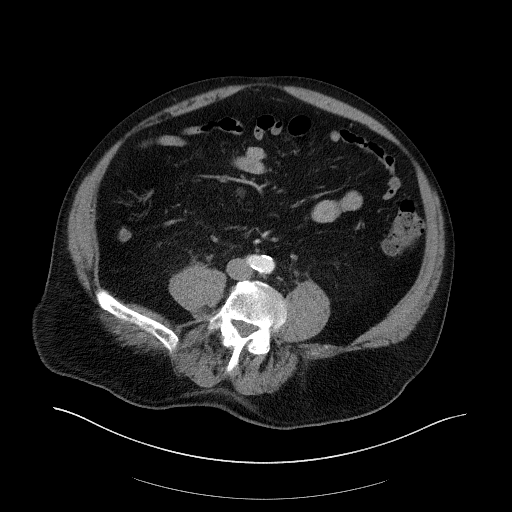
[im 73/146  mediastinal]
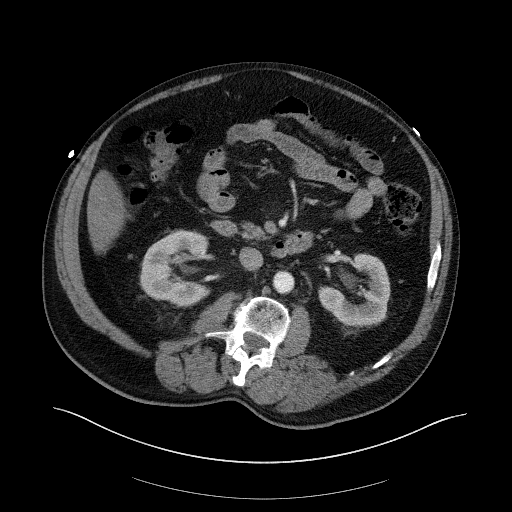
[im 85/146  mediastinal]
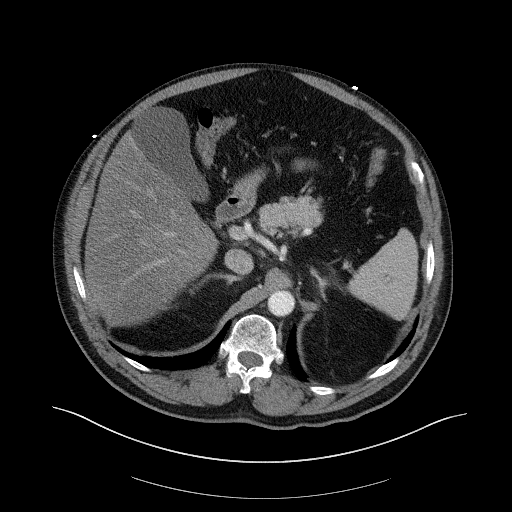
[im 97/146  mediastinal]
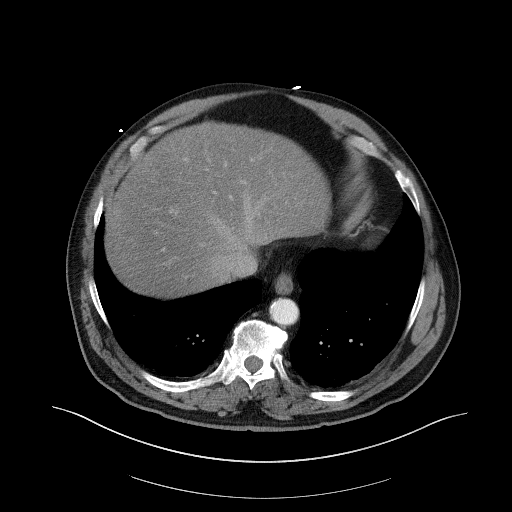
[im 109/146  mediastinal]
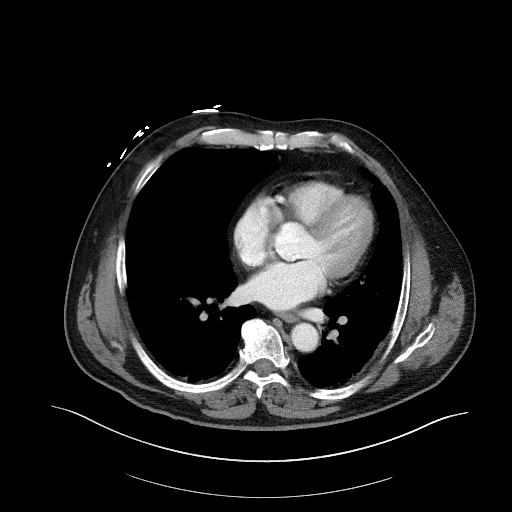
[im 109/146  bone]
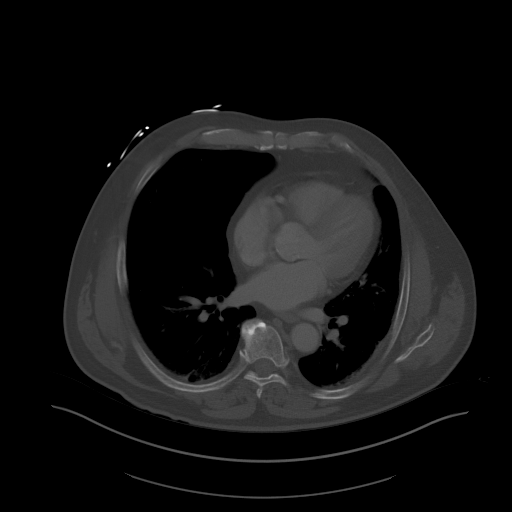
[im 121/146  mediastinal]
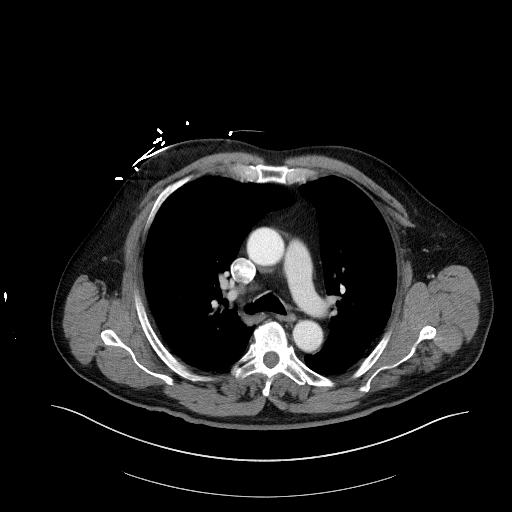
[im 133/146  mediastinal]
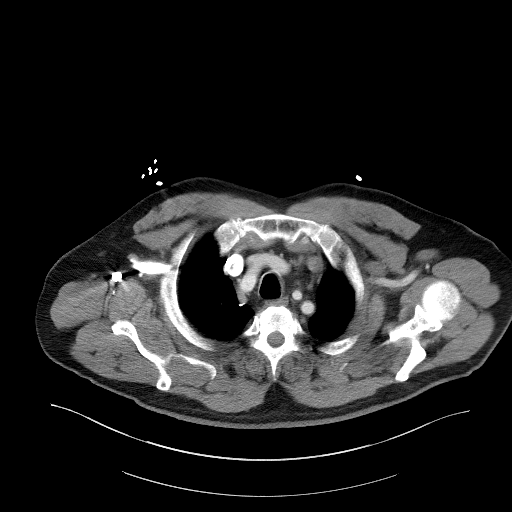

[Series 5: cap with 3mm st cor · coronal · 0.84mm/px · 3 of 193 slices shown]
[im 39/193  mediastinal]
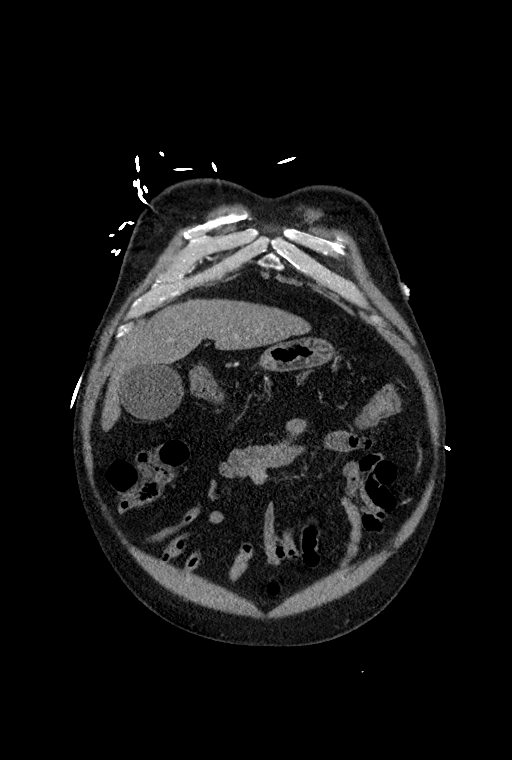
[im 77/193  mediastinal]
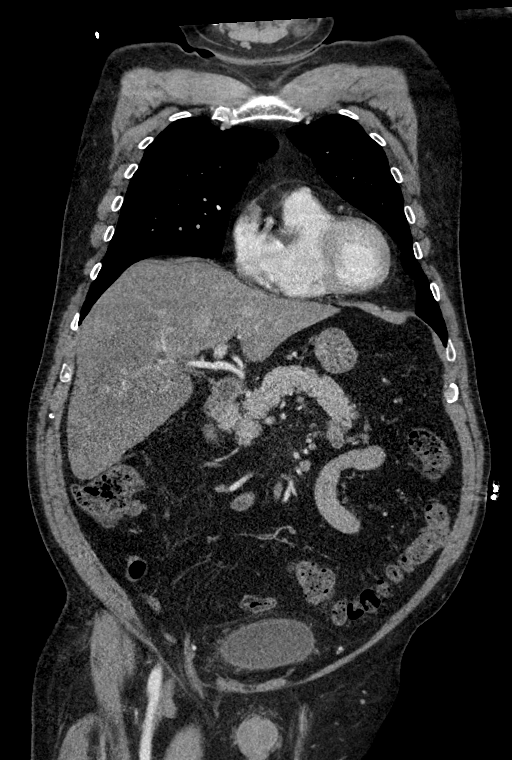
[im 116/193  mediastinal]
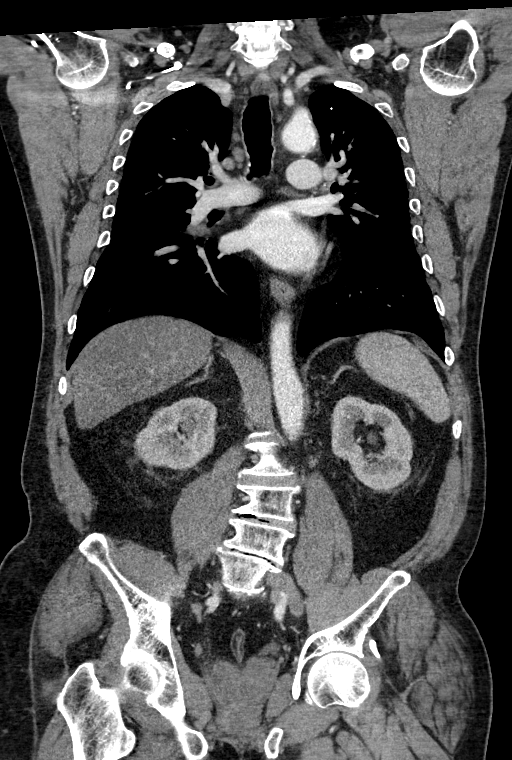

[14 of 36 positions shown; findings below may reference images not displayed]

RADIATION DOSE REDUCTION: This exam was performed according to the
departmental dose-optimization program which includes automated
exposure control, adjustment of the mA and/or kV according to
patient size and/or use of iterative reconstruction technique.

CONTRAST:  100mL OMNIPAQUE IOHEXOL 300 MG/ML  SOLN
FINDINGS: CT CHEST FINDINGS

Cardiovascular: Normal heart size. No pericardial effusion.
Atheromatous calcification of the aorta and coronaries.

Mediastinum/Nodes: Negative for adenopathy or mass. 12 mm left
thyroid nodule. No followup recommended (ref: [HOSPITAL]. [DATE]): 143-50).

Lungs/Pleura: There is no edema, consolidation, effusion, or
pneumothorax. Mild dependent atelectasis

Musculoskeletal: Spondylosis diffusely in the thoracic spine.
Subjective generalized osteopenia. No acute finding

CT ABDOMEN PELVIS FINDINGS

Hepatobiliary: Diffuse hepatic steatosis.No evidence of biliary
obstruction or stone.

Pancreas: Unremarkable.

Spleen: Unremarkable.

Adrenals/Urinary Tract: Negative adrenals. No hydronephrosis or
stone. Indistinct fat around the urinary bladder which shows
prominent wall thickness diffusely. There is history of painful
urination per technologist.

Stomach/Bowel:  No obstruction. No bowel inflammation

Vascular/Lymphatic: No acute vascular abnormality. Atheromatous
calcification of the aorta. No mass or adenopathy.

Reproductive:Symmetric enlargement of the prostate gland measuring
up to 6 cm in transverse span.

Other: No ascites or pneumoperitoneum.  Fatty right inguinal hernia.

Musculoskeletal: No acute abnormalities. Generalized lumbar spine
degeneration with scoliosis and exaggerated lumbar lordosis.
IMPRESSION: 1. Suspected cystitis.
2. Enlarged prostate.
3. Hepatic steatosis.
4. Aortic and coronary atherosclerosis.

## 2022-04-15 DIAGNOSIS — H25813 Combined forms of age-related cataract, bilateral: Secondary | ICD-10-CM | POA: Diagnosis not present

## 2022-04-15 DIAGNOSIS — E1136 Type 2 diabetes mellitus with diabetic cataract: Secondary | ICD-10-CM | POA: Diagnosis not present

## 2022-05-01 DIAGNOSIS — H2511 Age-related nuclear cataract, right eye: Secondary | ICD-10-CM | POA: Diagnosis not present

## 2022-05-01 DIAGNOSIS — H25811 Combined forms of age-related cataract, right eye: Secondary | ICD-10-CM | POA: Diagnosis not present

## 2022-05-08 DIAGNOSIS — H25812 Combined forms of age-related cataract, left eye: Secondary | ICD-10-CM | POA: Diagnosis not present

## 2022-05-08 DIAGNOSIS — H2512 Age-related nuclear cataract, left eye: Secondary | ICD-10-CM | POA: Diagnosis not present

## 2022-06-26 DIAGNOSIS — Z79899 Other long term (current) drug therapy: Secondary | ICD-10-CM | POA: Diagnosis not present

## 2022-09-16 DIAGNOSIS — Z961 Presence of intraocular lens: Secondary | ICD-10-CM | POA: Diagnosis not present

## 2022-09-23 DIAGNOSIS — E7800 Pure hypercholesterolemia, unspecified: Secondary | ICD-10-CM | POA: Diagnosis not present

## 2022-09-23 DIAGNOSIS — K5190 Ulcerative colitis, unspecified, without complications: Secondary | ICD-10-CM | POA: Diagnosis not present

## 2022-09-23 DIAGNOSIS — D72829 Elevated white blood cell count, unspecified: Secondary | ICD-10-CM | POA: Diagnosis not present

## 2022-09-23 DIAGNOSIS — Z79899 Other long term (current) drug therapy: Secondary | ICD-10-CM | POA: Diagnosis not present

## 2022-09-23 DIAGNOSIS — R7303 Prediabetes: Secondary | ICD-10-CM | POA: Diagnosis not present

## 2022-09-23 DIAGNOSIS — M461 Sacroiliitis, not elsewhere classified: Secondary | ICD-10-CM | POA: Diagnosis not present

## 2022-09-23 DIAGNOSIS — Z0000 Encounter for general adult medical examination without abnormal findings: Secondary | ICD-10-CM | POA: Diagnosis not present

## 2022-09-23 DIAGNOSIS — K219 Gastro-esophageal reflux disease without esophagitis: Secondary | ICD-10-CM | POA: Diagnosis not present

## 2022-11-05 DIAGNOSIS — L821 Other seborrheic keratosis: Secondary | ICD-10-CM | POA: Diagnosis not present

## 2022-11-05 DIAGNOSIS — L820 Inflamed seborrheic keratosis: Secondary | ICD-10-CM | POA: Diagnosis not present

## 2022-11-05 DIAGNOSIS — L814 Other melanin hyperpigmentation: Secondary | ICD-10-CM | POA: Diagnosis not present

## 2022-11-05 DIAGNOSIS — L570 Actinic keratosis: Secondary | ICD-10-CM | POA: Diagnosis not present

## 2022-11-05 DIAGNOSIS — D225 Melanocytic nevi of trunk: Secondary | ICD-10-CM | POA: Diagnosis not present

## 2023-01-10 DIAGNOSIS — D1801 Hemangioma of skin and subcutaneous tissue: Secondary | ICD-10-CM | POA: Diagnosis not present

## 2023-01-10 DIAGNOSIS — L820 Inflamed seborrheic keratosis: Secondary | ICD-10-CM | POA: Diagnosis not present

## 2023-01-10 DIAGNOSIS — L538 Other specified erythematous conditions: Secondary | ICD-10-CM | POA: Diagnosis not present

## 2023-01-10 DIAGNOSIS — L918 Other hypertrophic disorders of the skin: Secondary | ICD-10-CM | POA: Diagnosis not present

## 2023-01-10 DIAGNOSIS — D485 Neoplasm of uncertain behavior of skin: Secondary | ICD-10-CM | POA: Diagnosis not present

## 2023-01-10 DIAGNOSIS — R208 Other disturbances of skin sensation: Secondary | ICD-10-CM | POA: Diagnosis not present

## 2023-01-29 DIAGNOSIS — Z0000 Encounter for general adult medical examination without abnormal findings: Secondary | ICD-10-CM | POA: Diagnosis not present

## 2023-02-24 DIAGNOSIS — N3020 Other chronic cystitis without hematuria: Secondary | ICD-10-CM | POA: Diagnosis not present

## 2023-12-16 DIAGNOSIS — K219 Gastro-esophageal reflux disease without esophagitis: Secondary | ICD-10-CM | POA: Diagnosis not present

## 2023-12-16 DIAGNOSIS — R7303 Prediabetes: Secondary | ICD-10-CM | POA: Diagnosis not present

## 2023-12-16 DIAGNOSIS — E7800 Pure hypercholesterolemia, unspecified: Secondary | ICD-10-CM | POA: Diagnosis not present

## 2023-12-16 DIAGNOSIS — R799 Abnormal finding of blood chemistry, unspecified: Secondary | ICD-10-CM | POA: Diagnosis not present

## 2023-12-16 DIAGNOSIS — Z79899 Other long term (current) drug therapy: Secondary | ICD-10-CM | POA: Diagnosis not present

## 2023-12-16 DIAGNOSIS — Z0000 Encounter for general adult medical examination without abnormal findings: Secondary | ICD-10-CM | POA: Diagnosis not present

## 2023-12-19 DIAGNOSIS — Z08 Encounter for follow-up examination after completed treatment for malignant neoplasm: Secondary | ICD-10-CM | POA: Diagnosis not present

## 2023-12-19 DIAGNOSIS — L814 Other melanin hyperpigmentation: Secondary | ICD-10-CM | POA: Diagnosis not present

## 2023-12-19 DIAGNOSIS — L820 Inflamed seborrheic keratosis: Secondary | ICD-10-CM | POA: Diagnosis not present

## 2023-12-19 DIAGNOSIS — L821 Other seborrheic keratosis: Secondary | ICD-10-CM | POA: Diagnosis not present

## 2023-12-19 DIAGNOSIS — D225 Melanocytic nevi of trunk: Secondary | ICD-10-CM | POA: Diagnosis not present

## 2023-12-19 DIAGNOSIS — Z85828 Personal history of other malignant neoplasm of skin: Secondary | ICD-10-CM | POA: Diagnosis not present

## 2023-12-19 DIAGNOSIS — L84 Corns and callosities: Secondary | ICD-10-CM | POA: Diagnosis not present

## 2023-12-24 DIAGNOSIS — M25511 Pain in right shoulder: Secondary | ICD-10-CM | POA: Diagnosis not present

## 2023-12-24 DIAGNOSIS — M25512 Pain in left shoulder: Secondary | ICD-10-CM | POA: Diagnosis not present

## 2024-01-01 DIAGNOSIS — M25521 Pain in right elbow: Secondary | ICD-10-CM | POA: Diagnosis not present

## 2024-01-07 DIAGNOSIS — M25521 Pain in right elbow: Secondary | ICD-10-CM | POA: Diagnosis not present

## 2024-02-13 DIAGNOSIS — Z0000 Encounter for general adult medical examination without abnormal findings: Secondary | ICD-10-CM | POA: Diagnosis not present

## 2024-02-13 DIAGNOSIS — G629 Polyneuropathy, unspecified: Secondary | ICD-10-CM | POA: Diagnosis not present

## 2024-02-18 DIAGNOSIS — M25521 Pain in right elbow: Secondary | ICD-10-CM | POA: Diagnosis not present

## 2024-02-25 DIAGNOSIS — Z860100 Personal history of colon polyps, unspecified: Secondary | ICD-10-CM | POA: Diagnosis not present

## 2024-02-25 DIAGNOSIS — K5190 Ulcerative colitis, unspecified, without complications: Secondary | ICD-10-CM | POA: Diagnosis not present

## 2024-02-25 DIAGNOSIS — R12 Heartburn: Secondary | ICD-10-CM | POA: Diagnosis not present

## 2024-03-01 DIAGNOSIS — K6389 Other specified diseases of intestine: Secondary | ICD-10-CM | POA: Diagnosis not present

## 2024-03-01 DIAGNOSIS — K5100 Ulcerative (chronic) pancolitis without complications: Secondary | ICD-10-CM | POA: Diagnosis not present

## 2024-03-01 DIAGNOSIS — K626 Ulcer of anus and rectum: Secondary | ICD-10-CM | POA: Diagnosis not present

## 2024-03-01 DIAGNOSIS — K644 Residual hemorrhoidal skin tags: Secondary | ICD-10-CM | POA: Diagnosis not present

## 2024-03-01 DIAGNOSIS — K648 Other hemorrhoids: Secondary | ICD-10-CM | POA: Diagnosis not present

## 2024-03-01 DIAGNOSIS — K633 Ulcer of intestine: Secondary | ICD-10-CM | POA: Diagnosis not present

## 2024-03-01 DIAGNOSIS — K5289 Other specified noninfective gastroenteritis and colitis: Secondary | ICD-10-CM | POA: Diagnosis not present

## 2024-03-01 DIAGNOSIS — K6289 Other specified diseases of anus and rectum: Secondary | ICD-10-CM | POA: Diagnosis not present

## 2024-03-31 DIAGNOSIS — S76311A Strain of muscle, fascia and tendon of the posterior muscle group at thigh level, right thigh, initial encounter: Secondary | ICD-10-CM | POA: Diagnosis not present

## 2024-03-31 DIAGNOSIS — M545 Low back pain, unspecified: Secondary | ICD-10-CM | POA: Diagnosis not present
